# Patient Record
Sex: Female | Born: 1990 | Hispanic: Yes | Marital: Single | Smoking: Never smoker
Health system: Southern US, Community
[De-identification: ages and names within clinical notes are randomized; demographics above are authoritative.]

## PROBLEM LIST (undated history)

## (undated) DIAGNOSIS — Z789 Other specified health status: Secondary | ICD-10-CM

## (undated) HISTORY — PX: NO PAST SURGERIES: SHX2092

---

## 2017-09-26 LAB — OB RESULTS CONSOLE HIV ANTIBODY (ROUTINE TESTING): HIV: NONREACTIVE

## 2017-09-26 LAB — OB RESULTS CONSOLE RUBELLA ANTIBODY, IGM: Rubella: IMMUNE

## 2017-09-26 LAB — OB RESULTS CONSOLE ANTIBODY SCREEN: Antibody Screen: NEGATIVE

## 2017-09-26 LAB — OB RESULTS CONSOLE GC/CHLAMYDIA
CHLAMYDIA, DNA PROBE: NEGATIVE
Gonorrhea: NEGATIVE

## 2017-09-26 LAB — OB RESULTS CONSOLE ABO/RH: RH TYPE: POSITIVE

## 2017-09-26 LAB — OB RESULTS CONSOLE RPR: RPR: NONREACTIVE

## 2017-09-30 ENCOUNTER — Other Ambulatory Visit (HOSPITAL_COMMUNITY): Payer: Self-pay | Admitting: Nurse Practitioner

## 2017-09-30 DIAGNOSIS — Z3A13 13 weeks gestation of pregnancy: Secondary | ICD-10-CM

## 2017-09-30 DIAGNOSIS — Z3682 Encounter for antenatal screening for nuchal translucency: Secondary | ICD-10-CM

## 2017-10-02 ENCOUNTER — Encounter (HOSPITAL_COMMUNITY): Payer: Self-pay | Admitting: *Deleted

## 2017-10-04 ENCOUNTER — Encounter (HOSPITAL_COMMUNITY): Payer: Self-pay

## 2017-10-04 ENCOUNTER — Ambulatory Visit (HOSPITAL_COMMUNITY)
Admission: RE | Admit: 2017-10-04 | Discharge: 2017-10-04 | Disposition: A | Discharge status: Home / Self Care | Payer: Self-pay | Source: Ambulatory Visit | Attending: Nurse Practitioner | Admitting: Nurse Practitioner

## 2017-10-04 DIAGNOSIS — Z3682 Encounter for antenatal screening for nuchal translucency: Secondary | ICD-10-CM | POA: Insufficient documentation

## 2017-10-04 DIAGNOSIS — Z3A13 13 weeks gestation of pregnancy: Secondary | ICD-10-CM

## 2017-10-04 HISTORY — DX: Other specified health status: Z78.9

## 2017-10-04 IMAGING — US US MFM FETAL NUCHAL TRANSLUCENCY
1 series · 15 of 28 positions shown · non-contrast
Comparison: none

[Series 1: us mfm fetal nuchal translucency · 15 of 36 slices shown]
[im 1/36]
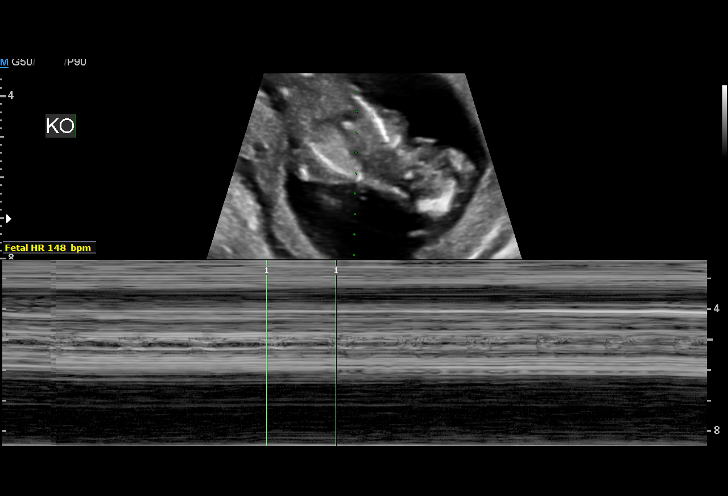
[im 3/36]
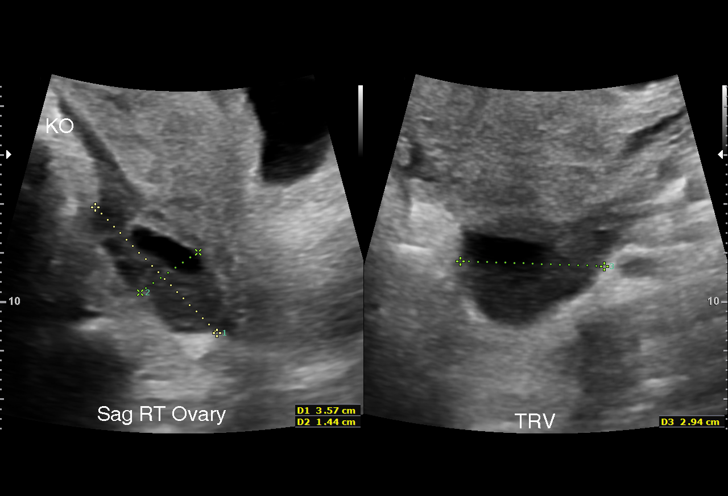
[im 6/36]
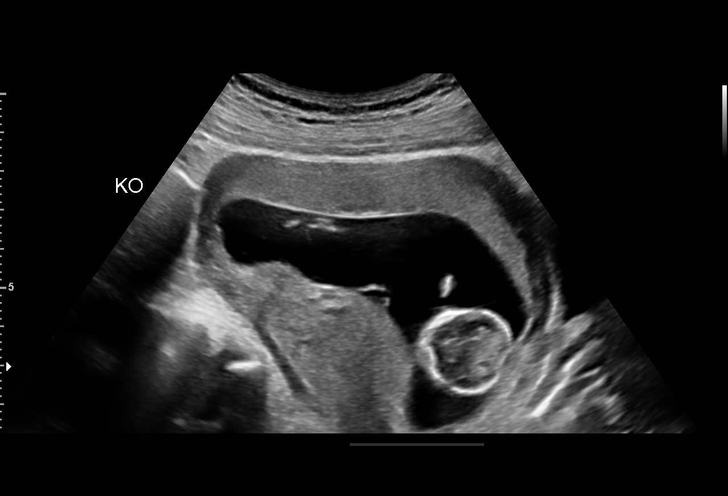
[im 8/36]
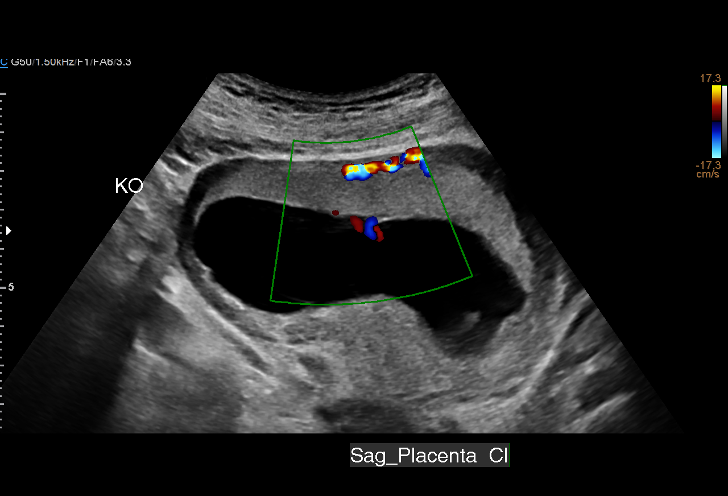
[im 11/36]
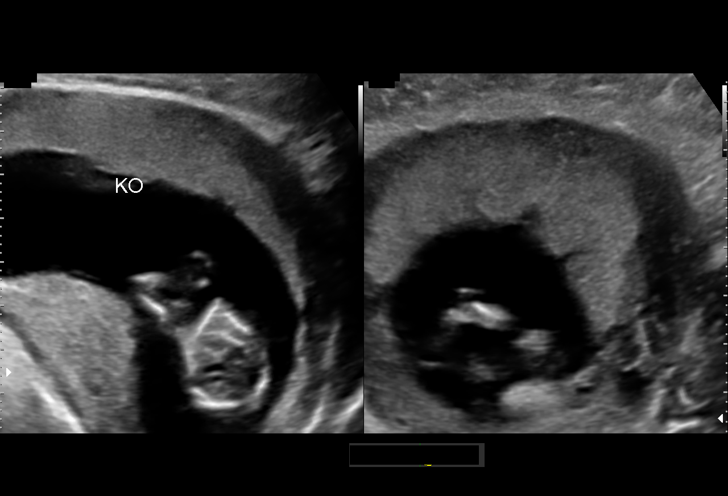
[im 13/36]
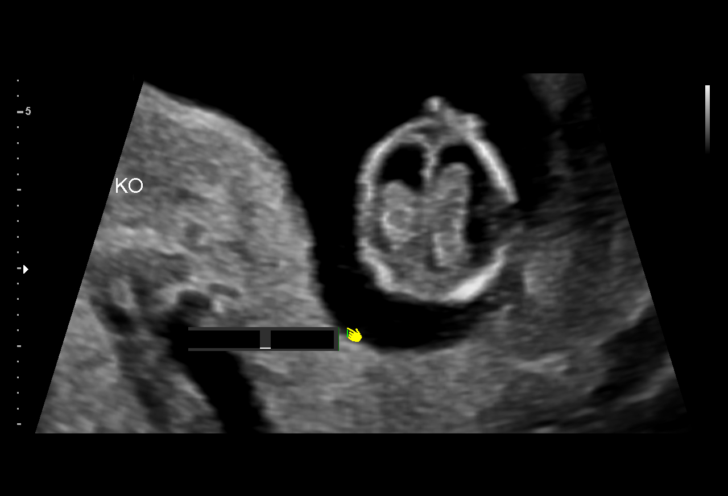
[im 16/36]
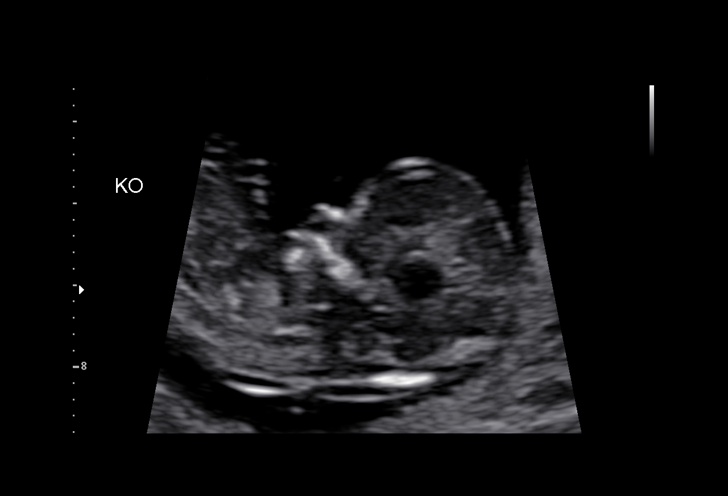
[im 19/36]
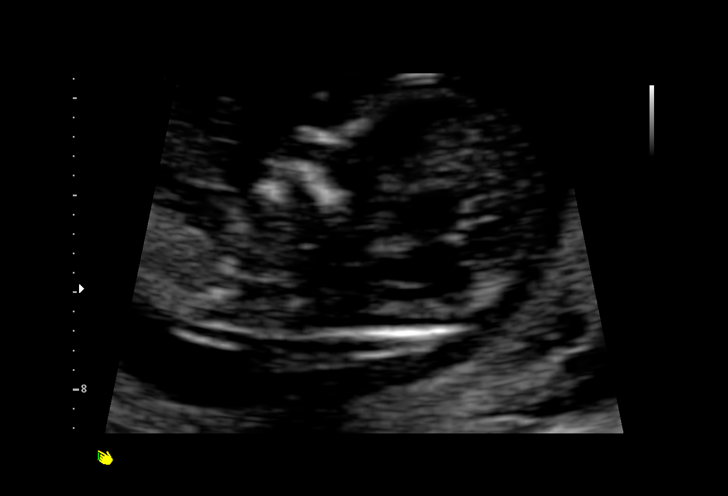
[im 20/36]
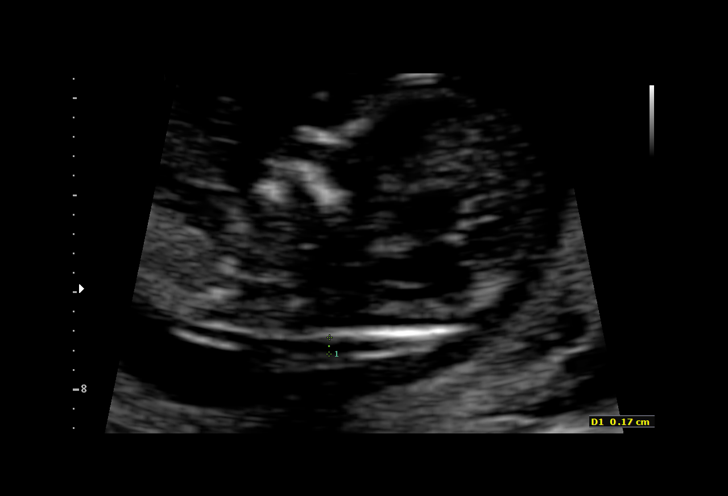
[im 23/36]
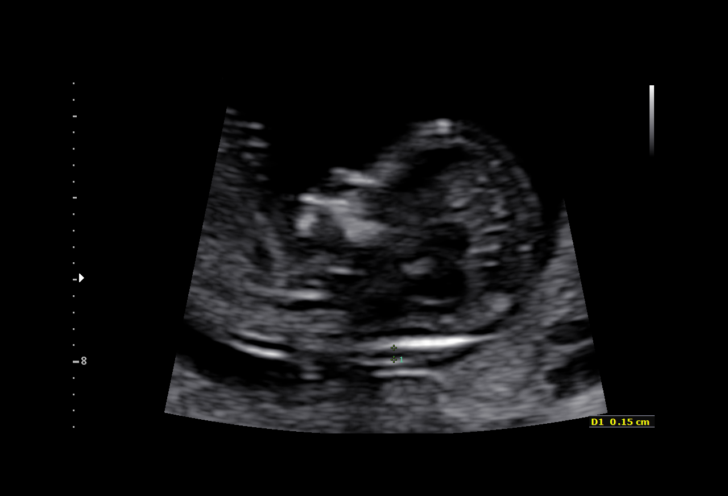
[im 25/36]
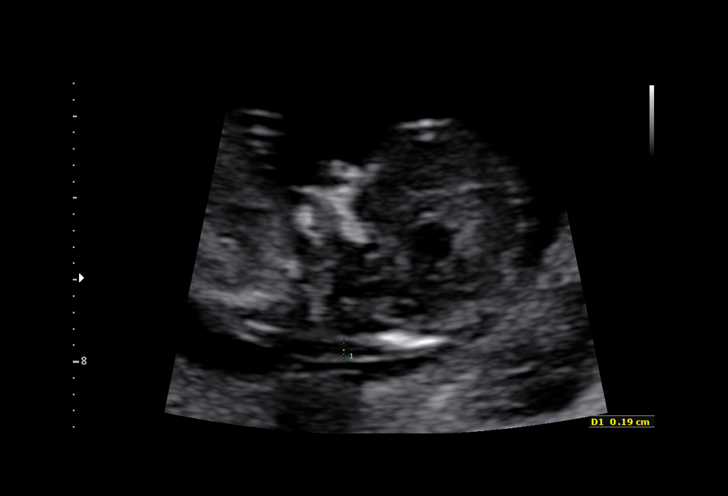
[im 28/36]
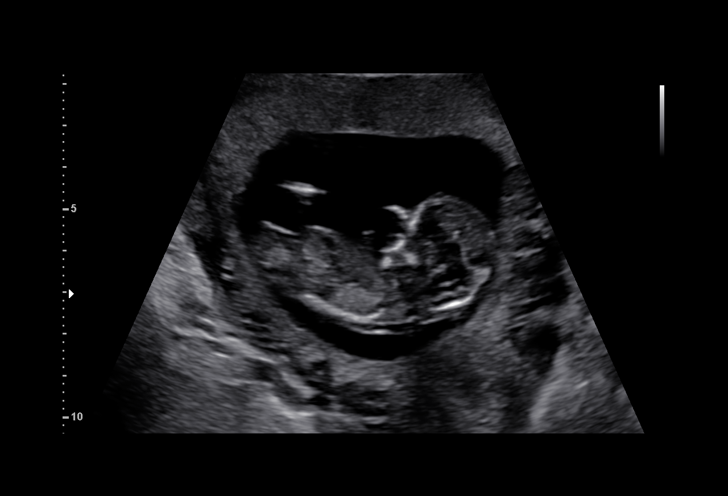
[im 30/36]
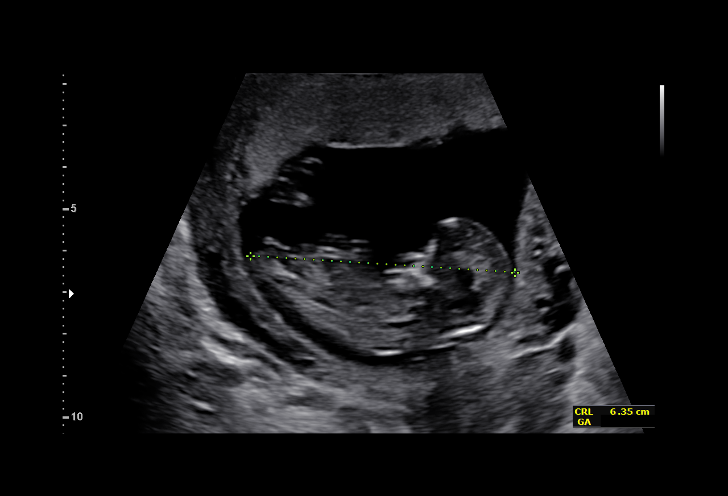
[im 33/36]
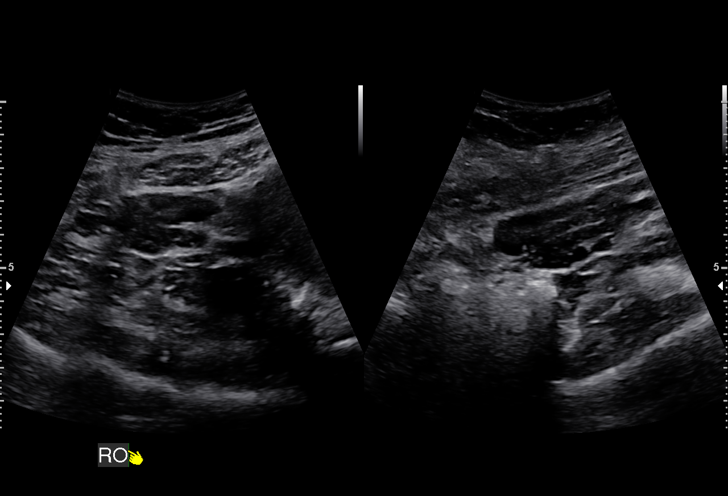
[im 36/36]
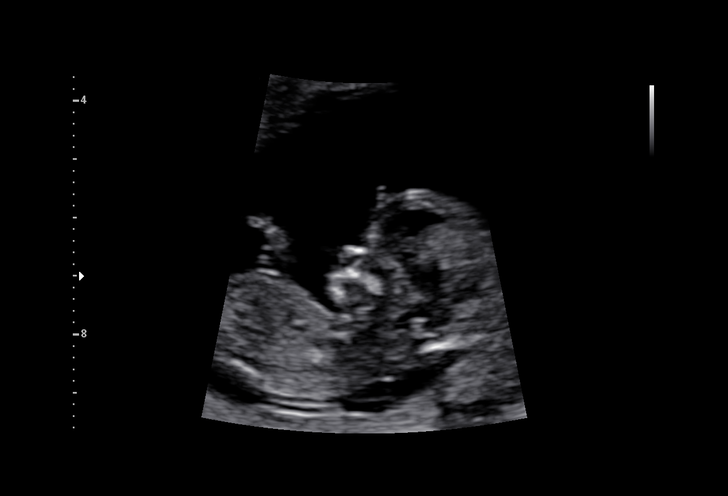

[15 of 28 positions shown; findings below may reference images not displayed]

[REDACTED]-
Faculty Physician

TRANSLUCENCY

1  MERVET             [PHONE_NUMBER]      [PHONE_NUMBER]     [PHONE_NUMBER]
Indications

13 weeks gestation of pregnancy
Encounter for nuchal translucency              [58]
OB History

Gravidity:    2         Term:   1        Prem:   0        SAB:   0
TOP:          0       Ectopic:  0        Living: 1
Fetal Evaluation

Num Of Fetuses:     1
Preg. Location:     Intrauterine
Gest. Sac:          Intrauterine
Fetal Pole:         Visualized
Fetal Heart         148
Rate(bpm):
Cardiac Activity:   Observed
Gestational Age

LMP:           13w 0d        Date:  [DATE]                 EDD:   [DATE]
Best:          13w 0d     Det. By:  LMP  ([DATE])          EDD:   [DATE]
1st Trimester Genetic Sonogram Screening

CRL:            63.5  mm    G. Age:   12w 4d                 EDD:   [DATE]
Nuc Trans:       1.5  mm
Nasal Bone:                 Present
Cervix Uterus Adnexa

Cervix
Closed

Uterus
No abnormality visualized.

Right Ovary
Within normal limits.

Cul De Sac:   No free fluid seen.

Adnexa:       No abnormality visualized.
Impression

IUP at 13+0 weeks, here for first trimester screen
Normal fetal cardiac activity
Normal fetal morphology; nasal bone present
CRL confirms menstrual dating
Nuchal translucency measures 1.5mm
Recommendations

Serum analytes to be drawn today
Recommend anatomic survey in 6-8 weeks

## 2017-10-16 ENCOUNTER — Other Ambulatory Visit: Payer: Self-pay

## 2017-10-29 LAB — OB RESULTS CONSOLE HEPATITIS B SURFACE ANTIGEN: Hepatitis B Surface Ag: NEGATIVE

## 2017-12-03 NOTE — L&D Delivery Note (Addendum)
Delivery Note At 5:41 AM a viable and healthy female was delivered via Vaginal, Spontaneous (Presentation: cephalid; LOA  ).  APGAR: 9, 9;  Placenta status: spontaneous, intact .  Cord 3 vessel with no complications   Anesthesia:  epidural Episiotomy: None Lacerations: 1st degree Suture Repair: No repair needed, wound edges already well approximated and wound hemostatic Est. Blood Loss (mL): 200  Mom to postpartum.  Baby to Couplet care / Skin to Skin.  Myrene BuddyJacob Fletcher 03/27/2018, 5:58 AM  I was present with the resident for the entire delivery. I agree with the above note.  Thressa ShellerHeather Hogan 7:59 AM 04/02/18

## 2018-03-14 LAB — OB RESULTS CONSOLE GBS: STREP GROUP B AG: NEGATIVE

## 2018-03-27 ENCOUNTER — Inpatient Hospital Stay (HOSPITAL_COMMUNITY): Payer: Medicaid Other | Admitting: Anesthesiology

## 2018-03-27 ENCOUNTER — Inpatient Hospital Stay (HOSPITAL_COMMUNITY)
Admission: AD | Admit: 2018-03-27 | Discharge: 2018-03-28 | DRG: 807 | Disposition: A | Discharge status: Home / Self Care | Payer: Medicaid Other | Source: Ambulatory Visit | Attending: Obstetrics & Gynecology | Admitting: Obstetrics & Gynecology

## 2018-03-27 ENCOUNTER — Encounter (HOSPITAL_COMMUNITY): Payer: Self-pay | Admitting: *Deleted

## 2018-03-27 DIAGNOSIS — O4292 Full-term premature rupture of membranes, unspecified as to length of time between rupture and onset of labor: Principal | ICD-10-CM | POA: Diagnosis present

## 2018-03-27 DIAGNOSIS — Z3A37 37 weeks gestation of pregnancy: Secondary | ICD-10-CM

## 2018-03-27 LAB — CBC
HCT: 35.4 % — ABNORMAL LOW (ref 36.0–46.0)
Hemoglobin: 12.3 g/dL (ref 12.0–15.0)
MCH: 29.6 pg (ref 26.0–34.0)
MCHC: 34.7 g/dL (ref 30.0–36.0)
MCV: 85.1 fL (ref 78.0–100.0)
PLATELETS: 210 10*3/uL (ref 150–400)
RBC: 4.16 MIL/uL (ref 3.87–5.11)
RDW: 13.8 % (ref 11.5–15.5)
WBC: 16 10*3/uL — AB (ref 4.0–10.5)

## 2018-03-27 LAB — ABO/RH: ABO/RH(D): A POS

## 2018-03-27 LAB — COMPREHENSIVE METABOLIC PANEL
ALT: 23 U/L (ref 14–54)
AST: 25 U/L (ref 15–41)
Albumin: 2.9 g/dL — ABNORMAL LOW (ref 3.5–5.0)
Alkaline Phosphatase: 168 U/L — ABNORMAL HIGH (ref 38–126)
Anion gap: 11 (ref 5–15)
BILIRUBIN TOTAL: 0.5 mg/dL (ref 0.3–1.2)
BUN: 7 mg/dL (ref 6–20)
CO2: 19 mmol/L — ABNORMAL LOW (ref 22–32)
CREATININE: 0.47 mg/dL (ref 0.44–1.00)
Calcium: 9 mg/dL (ref 8.9–10.3)
Chloride: 105 mmol/L (ref 101–111)
Glucose, Bld: 94 mg/dL (ref 65–99)
POTASSIUM: 3.9 mmol/L (ref 3.5–5.1)
Sodium: 135 mmol/L (ref 135–145)
TOTAL PROTEIN: 6.5 g/dL (ref 6.5–8.1)

## 2018-03-27 LAB — RPR: RPR Ser Ql: NONREACTIVE

## 2018-03-27 LAB — TYPE AND SCREEN
ABO/RH(D): A POS
Antibody Screen: NEGATIVE

## 2018-03-27 MED ORDER — LACTATED RINGERS IV SOLN
INTRAVENOUS | Status: DC
  Administered 2018-03-27 (×2): via INTRAVENOUS

## 2018-03-27 MED ORDER — FENTANYL 2.5 MCG/ML BUPIVACAINE 1/10 % EPIDURAL INFUSION (WH - ANES)
14.0000 mL/h | INTRAMUSCULAR | Status: DC | PRN
  Administered 2018-03-27: 12 mL/h via EPIDURAL
  Filled 2018-03-27: qty 100

## 2018-03-27 MED ORDER — SIMETHICONE 80 MG PO CHEW: 80.0000 mg | CHEWABLE_TABLET | ORAL | Status: DC | PRN

## 2018-03-27 MED ORDER — PRENATAL MULTIVITAMIN CH
1.0000 | ORAL_TABLET | Freq: Every day | ORAL | Status: DC
  Administered 2018-03-28: 1 via ORAL

## 2018-03-27 MED ORDER — DIPHENHYDRAMINE HCL 25 MG PO CAPS: 25.0000 mg | ORAL_CAPSULE | Freq: Four times a day (QID) | ORAL | Status: DC | PRN

## 2018-03-27 MED ORDER — LIDOCAINE HCL (PF) 1 % IJ SOLN
INTRAMUSCULAR | Status: DC | PRN
  Administered 2018-03-27: 6 mL
  Administered 2018-03-27: 4 mL

## 2018-03-27 MED ORDER — OXYTOCIN 40 UNITS IN LACTATED RINGERS INFUSION - SIMPLE MED
2.5000 [IU]/h | INTRAVENOUS | Status: DC
  Filled 2018-03-27: qty 1000

## 2018-03-27 MED ORDER — EPHEDRINE 5 MG/ML INJ
10.0000 mg | INTRAVENOUS | Status: DC | PRN
  Filled 2018-03-27: qty 2

## 2018-03-27 MED ORDER — ACETAMINOPHEN 325 MG PO TABS
650.0000 mg | ORAL_TABLET | ORAL | Status: DC | PRN
  Administered 2018-03-27: 650 mg via ORAL
  Filled 2018-03-27: qty 2

## 2018-03-27 MED ORDER — PHENYLEPHRINE 40 MCG/ML (10ML) SYRINGE FOR IV PUSH (FOR BLOOD PRESSURE SUPPORT)
80.0000 ug | PREFILLED_SYRINGE | INTRAVENOUS | Status: DC | PRN
Start: 2018-03-27 — End: 2018-03-27
  Filled 2018-03-27: qty 10
  Filled 2018-03-27: qty 5

## 2018-03-27 MED ORDER — PHENYLEPHRINE 40 MCG/ML (10ML) SYRINGE FOR IV PUSH (FOR BLOOD PRESSURE SUPPORT)
80.0000 ug | PREFILLED_SYRINGE | INTRAVENOUS | Status: DC | PRN
  Filled 2018-03-27: qty 5

## 2018-03-27 MED ORDER — OXYCODONE-ACETAMINOPHEN 5-325 MG PO TABS: 1.0000 | ORAL_TABLET | ORAL | Status: DC | PRN

## 2018-03-27 MED ORDER — DIPHENHYDRAMINE HCL 50 MG/ML IJ SOLN: 12.5000 mg | INTRAMUSCULAR | Status: DC | PRN

## 2018-03-27 MED ORDER — DIBUCAINE 1 % RE OINT: 1.0000 "application " | TOPICAL_OINTMENT | RECTAL | Status: DC | PRN

## 2018-03-27 MED ORDER — TETANUS-DIPHTH-ACELL PERTUSSIS 5-2.5-18.5 LF-MCG/0.5 IM SUSP: 0.5000 mL | Freq: Once | INTRAMUSCULAR | Status: DC

## 2018-03-27 MED ORDER — LACTATED RINGERS IV SOLN
500.0000 mL | Freq: Once | INTRAVENOUS | Status: AC
  Administered 2018-03-27: 500 mL via INTRAVENOUS

## 2018-03-27 MED ORDER — SENNOSIDES-DOCUSATE SODIUM 8.6-50 MG PO TABS
2.0000 | ORAL_TABLET | ORAL | Status: DC
  Administered 2018-03-27: 2 via ORAL
  Filled 2018-03-27: qty 2

## 2018-03-27 MED ORDER — OXYTOCIN BOLUS FROM INFUSION
500.0000 mL | Freq: Once | INTRAVENOUS | Status: AC
  Administered 2018-03-27: 500 mL via INTRAVENOUS

## 2018-03-27 MED ORDER — EPHEDRINE 5 MG/ML INJ
10.0000 mg | INTRAVENOUS | Status: DC | PRN
Start: 2018-03-27 — End: 2018-03-27
  Filled 2018-03-27: qty 2

## 2018-03-27 MED ORDER — COCONUT OIL OIL: 1.0000 "application " | TOPICAL_OIL | Status: DC | PRN

## 2018-03-27 MED ORDER — ONDANSETRON HCL 4 MG PO TABS: 4.0000 mg | ORAL_TABLET | ORAL | Status: DC | PRN

## 2018-03-27 MED ORDER — ONDANSETRON HCL 4 MG/2ML IJ SOLN: 4.0000 mg | INTRAMUSCULAR | Status: DC | PRN

## 2018-03-27 MED ORDER — LACTATED RINGERS IV SOLN: 500.0000 mL | INTRAVENOUS | Status: DC | PRN

## 2018-03-27 MED ORDER — SOD CITRATE-CITRIC ACID 500-334 MG/5ML PO SOLN: 30.0000 mL | ORAL | Status: DC | PRN

## 2018-03-27 MED ORDER — WITCH HAZEL-GLYCERIN EX PADS: 1.0000 "application " | MEDICATED_PAD | CUTANEOUS | Status: DC | PRN

## 2018-03-27 MED ORDER — LIDOCAINE HCL (PF) 1 % IJ SOLN
30.0000 mL | INTRAMUSCULAR | Status: DC | PRN
  Filled 2018-03-27: qty 30

## 2018-03-27 MED ORDER — IBUPROFEN 600 MG PO TABS
600.0000 mg | ORAL_TABLET | Freq: Four times a day (QID) | ORAL | Status: DC
  Administered 2018-03-27 – 2018-03-28 (×5): 600 mg via ORAL
  Filled 2018-03-27 (×5): qty 1

## 2018-03-27 MED ORDER — FENTANYL CITRATE (PF) 100 MCG/2ML IJ SOLN
50.0000 ug | INTRAMUSCULAR | Status: DC | PRN
  Administered 2018-03-27: 100 ug via INTRAVENOUS
  Filled 2018-03-27: qty 2

## 2018-03-27 MED ORDER — ZOLPIDEM TARTRATE 5 MG PO TABS: 5.0000 mg | ORAL_TABLET | Freq: Every evening | ORAL | Status: DC | PRN

## 2018-03-27 MED ORDER — BENZOCAINE-MENTHOL 20-0.5 % EX AERO: 1.0000 "application " | INHALATION_SPRAY | CUTANEOUS | Status: DC | PRN

## 2018-03-27 MED ORDER — ONDANSETRON HCL 4 MG/2ML IJ SOLN
4.0000 mg | Freq: Four times a day (QID) | INTRAMUSCULAR | Status: DC | PRN
  Administered 2018-03-27: 4 mg via INTRAVENOUS
  Filled 2018-03-27: qty 2

## 2018-03-27 MED ORDER — ACETAMINOPHEN 325 MG PO TABS
650.0000 mg | ORAL_TABLET | ORAL | Status: DC | PRN
Start: 2018-03-27 — End: 2018-03-27

## 2018-03-27 MED ORDER — OXYCODONE-ACETAMINOPHEN 5-325 MG PO TABS: 2.0000 | ORAL_TABLET | ORAL | Status: DC | PRN

## 2018-03-27 MED ORDER — FLEET ENEMA 7-19 GM/118ML RE ENEM: 1.0000 | ENEMA | RECTAL | Status: DC | PRN

## 2018-03-27 NOTE — Anesthesia Preprocedure Evaluation (Signed)

## 2018-03-27 NOTE — Anesthesia Postprocedure Evaluation (Signed)
Anesthesia Post Note  Patient: Linda Snyder  Procedure(s) Performed: AN AD HOC LABOR EPIDURAL     Patient location during evaluation: Mother Baby Anesthesia Type: Epidural Level of consciousness: awake and alert Pain management: pain level controlled Vital Signs Assessment: post-procedure vital signs reviewed and stable Respiratory status: spontaneous breathing, nonlabored ventilation and respiratory function stable Cardiovascular status: stable Postop Assessment: no headache, no backache and epidural receding Anesthetic complications: no    Last Vitals:  Vitals:   03/27/18 0745 03/27/18 0900  BP: 119/73 121/65  Pulse: (!) 113 (!) 103  Resp: 20 20  Temp: 37.3 C 37.6 C    Last Pain:  Vitals:   03/27/18 0900  TempSrc: Oral  PainSc: 3    Pain Goal:                 Malessa Zartman N

## 2018-03-27 NOTE — MAU Note (Signed)
Pt presents to MAU c/o SROM at 1700. Pt reports a yellowish fluid with some blood. Pt states no FM since her water broke.

## 2018-03-27 NOTE — Lactation Note (Addendum)
This note was copied from a baby's chart. Lactation Consultation Note  Patient Name: Linda Snyder ZOXWR'UToday's Date: 03/27/2018 Reason for consult: Initial assessment;Early term [id number]  11 hours old late preterm female who is being exclusively formula fed while in the hospital, mom plans to pump and bottle once her milk comes in like she did with her first child. Lactogenesis II and engorgement were discussed, mom requested a hand pump. Encouraged mom to provide as much breastmilk to her baby as she can; even if it's on a bottle; discussed benefits of breastmilk. Reviewed BF brochure (SP), BF resources and feeding diary (SP) mom is aware of LC services and will contact PRN.  Maternal Data Formula Feeding for Exclusion: No Does the patient have breastfeeding experience prior to this delivery?: Yes    Interventions Interventions: Breast feeding basics reviewed  Lactation Tools Discussed/Used WIC Program: Yes Pump Review: Setup, frequency, and cleaning Initiated by:: MPeck Date initiated:: 03/27/18   Consult Status Consult Status: Complete    Linda Snyder 03/27/2018, 5:34 PM

## 2018-03-27 NOTE — H&P (Addendum)
OBSTETRIC ADMISSION HISTORY AND PHYSICAL  Linda Snyder is a 27 y.o. female G2P1001 with IUP at [redacted]w[redacted]d by LMP presenting for SROM. She reports +FMs, No LOF, no VB, no blurry vision, headaches or peripheral edema, and RUQ pain.  She plans on breast feeding. She is undecided about birth control. She [facility] her [facility]   Dating: By LMP --->  Estimated Date of Delivery: 04/11/18  Sono:    @[redacted]w[redacted]d , CWD, normal anatomy, cephalic presentation   Prenatal History/Complications:  Past Medical History: Past Medical History:  Diagnosis Date  . Medical history non-contributory     Past Surgical History: Past Surgical History:  Procedure Laterality Date  . NO PAST SURGERIES      Obstetrical History: OB History    Gravida  2   Para  1   Term  1   Preterm      AB      Living  1     SAB      TAB      Ectopic      Multiple      Live Births              Social History: Social History   Socioeconomic History  . Marital status: Single    Spouse name: Not on file  . Number of children: Not on file  . Years of education: Not on file  . Highest education level: Not on file  Occupational History  . Not on file  Social Needs  . Financial resource strain: Not on file  . Food insecurity:    Worry: Not on file    Inability: Not on file  . Transportation needs:    Medical: Not on file    Non-medical: Not on file  Tobacco Use  . Smoking status: Never Smoker  . Smokeless tobacco: Never Used  Substance and Sexual Activity  . Alcohol use: No  . Drug use: No  . Sexual activity: Not on file  Lifestyle  . Physical activity:    Days per week: Not on file    Minutes per session: Not on file  . Stress: Not on file  Relationships  . Social connections:    Talks on phone: Not on file    Gets together: Not on file    Attends religious service: Not on file    Active member of club or organization: Not on file    Attends meetings of clubs or  organizations: Not on file    Relationship status: Not on file  Other Topics Concern  . Not on file  Social History Narrative  . Not on file    Family History: History reviewed. No pertinent family history.  Allergies: No Known Allergies  Medications Prior to Admission  Medication Sig Dispense Refill Last Dose  . Prenatal Vit-Fe Fumarate-FA (PRENATAL VITAMIN PO) Take by mouth.   Taking     Review of Systems   All systems reviewed and negative except as stated in HPI  Last menstrual period 07/05/2017. General appearance: alert and cooperative Lungs: clear to auscultation bilaterally Heart: regular rate and rhythm Abdomen: soft, non-tender; bowel sounds normal Extremities: Homans sign is negative, no sign of DVT Presentation: cephalic Fetal monitoringBaseline: 140 bpm, Variability: Good {> 6 bpm), Accelerations: Non-reactive but appropriate for gestational age and Decelerations: Absent Uterine activityFrequency: Every 2-3 minutes Dilation: 6 Effacement (%): 90 Station: -2 Exam by:: Rockwell [place] rn    Prenatal labs: ABO, Rh: A/Positive/-- (10/25 0000)  Antibody: Negative (10/25 0000) Rubella: Immune (10/25 0000) RPR: Nonreactive (10/25 0000)  HBsAg: Negative (11/27 0000)  HIV: Non-reactive (10/25 0000)  GBS: Negative (04/12 0000)  1 hr Glucola 1 hour -> 125 Genetic screening  neg Anatomy US normal  Prenatal Transfer Tool  Maternal Diabetes: No Genetic Screening: Normal Maternal Ultrasounds/Referrals: Normal Fetal Ultrasounds or other Referrals:  None Maternal Substance Abuse:  No Significant Maternal Medications:  None Significant Maternal Lab Results: None  Results for orders placed or performed during the hospital encounter of 03/27/18 (from the past 24 hour(s))  CBC   Collection Time: 03/27/18  2:04 AM  Result Value Ref Range   WBC 16.0 (H) 4.0 - 10.5 K/uL   RBC 4.16 3.87 - 5.11 MIL/uL   Hemoglobin 12.3 12.0 - 15.0 g/dL   HCT 36.635.4 (L) 44.036.0 - 34.746.0 %    MCV 85.1 78.0 - 100.0 fL   MCH 29.6 26.0 - 34.0 pg   MCHC 34.7 30.0 - 36.0 g/dL   RDW 42.513.8 95.611.5 - 38.715.5 %   Platelets 210 150 - 400 K/uL    Patient Active Problem List   Diagnosis Date Noted  . Labor and delivery, indication for care 03/27/2018    Assessment/Plan:  Linda Snyder is a 27 y.o. G2P1001 at 4150w6d here for SROM @ 1700. Uncomplicated prenatal care, will admit for expectant management. Can likely start pitocin if does not continue to change rapidly.  #Labor: s/p srom, expectant management #Pain: Analgesia prn #FWB: Cat 1 #ID:  n/a #MOF: Breast #MOC: undecided #Circ:  N/A  Myrene BuddyJacob Fletcher, MD  03/27/2018, 2:26 AM   I confirm that I have verified the information documented in the resident's note and that I have also personally reperformed the physical exam and all medical decision making activities.    Thressa ShellerHeather Hogan 4:04 AM 03/27/18

## 2018-03-27 NOTE — Anesthesia Procedure Notes (Signed)
Epidural Patient location during procedure: OB  Staffing Anesthesiologist: Colden Samaras, MD  Preanesthetic Checklist Completed: patient identified, pre-op evaluation, timeout performed, IV checked, risks and benefits discussed and monitors and equipment checked  Epidural Patient position: sitting Prep: DuraPrep Patient monitoring: blood pressure and continuous pulse ox Approach: right paramedian Location: L3-L4 Injection technique: LOR air  Needle:  Needle type: Tuohy  Needle gauge: 17 G Needle insertion depth: 5 cm Catheter type: closed end flexible Catheter size: 19 Gauge Catheter at skin depth: 10 cm Test dose: negative  Assessment Sensory level: T8  Additional Notes   Dosing of Epidural:  1st dose, through catheter .............................................  Xylocaine 40 mg  2nd dose, through catheter, after waiting 3 minutes.........Xylocaine 60 mg    As each dose occurred, patient was free of IV sx; and patient exhibited no evidence of SA injection.  Patient is more comfortable after epidural dosed. Please see RN's note for documentation of vital signs,and FHR which are stable.  Patient reminded not to try to ambulate with numb legs, and that an RN must be present when she attempts to get up.          

## 2018-03-28 LAB — BIRTH TISSUE RECOVERY COLLECTION (PLACENTA DONATION)

## 2018-03-28 MED ORDER — IBUPROFEN 600 MG PO TABS: 600.0000 mg | ORAL_TABLET | Freq: Four times a day (QID) | ORAL | 0 refills | Status: DC | PRN

## 2018-03-28 NOTE — Discharge Instructions (Signed)
Vaginal Delivery, Care After °Refer to this sheet in the next few weeks. These instructions provide you with information about caring for yourself after vaginal delivery. Your health care provider may also give you more specific instructions. Your treatment has been planned according to current medical practices, but problems sometimes occur. Call your health care provider if you have any problems or questions. °What can I expect after the procedure? °After vaginal delivery, it is common to have: °· Some bleeding from your vagina. °· Soreness in your abdomen, your vagina, and the area of skin between your vaginal opening and your anus (perineum). °· Pelvic cramps. °· Fatigue. ° °Follow these instructions at home: °Medicines °· Take over-the-counter and prescription medicines only as told by your health care provider. °· If you were prescribed an antibiotic medicine, take it as told by your health care provider. Do not stop taking the antibiotic until it is finished. °Driving ° °· Do not drive or operate heavy machinery while taking prescription pain medicine. °· Do not drive for 24 hours if you received a sedative. °Lifestyle °· Do not drink alcohol. This is especially important if you are breastfeeding or taking medicine to relieve pain. °· Do not use tobacco products, including cigarettes, chewing tobacco, or e-cigarettes. If you need help quitting, ask your health care provider. °Eating and drinking °· Drink at least 8 eight-ounce glasses of water every day unless you are told not to by your health care provider. If you choose to breastfeed your baby, you may need to drink more water than this. °· Eat high-fiber foods every day. These foods may help prevent or relieve constipation. High-fiber foods include: °? Whole grain cereals and breads. °? Brown rice. °? Beans. °? Fresh fruits and vegetables. °Activity °· Return to your normal activities as told by your health care provider. Ask your health care provider  what activities are safe for you. °· Rest as much as possible. Try to rest or take a nap when your baby is sleeping. °· Do not lift anything that is heavier than your baby or 10 lb (4.5 kg) until your health care provider says that it is safe. °· Talk with your health care provider about when you can engage in sexual activity. This may depend on your: °? Risk of infection. °? Rate of healing. °? Comfort and desire to engage in sexual activity. °Vaginal Care °· If you have an episiotomy or a vaginal tear, check the area every day for signs of infection. Check for: °? More redness, swelling, or pain. °? More fluid or blood. °? Warmth. °? Pus or a bad smell. °· Do not use tampons or douches until your health care provider says this is safe. °· Watch for any blood clots that may pass from your vagina. These may look like clumps of dark red, brown, or black discharge. °General instructions °· Keep your perineum clean and dry as told by your health care provider. °· Wear loose, comfortable clothing. °· Wipe from front to back when you use the toilet. °· Ask your health care provider if you can shower or take a bath. If you had an episiotomy or a perineal tear during labor and delivery, your health care provider may tell you not to take baths for a certain length of time. °· Wear a bra that supports your breasts and fits you well. °· If possible, have someone help you with household activities and help care for your baby for at least a few days after   you leave the hospital. °· Keep all follow-up visits for you and your baby as told by your health care provider. This is important. °Contact a health care provider if: °· You have: °? Vaginal discharge that has a bad smell. °? Difficulty urinating. °? Pain when urinating. °? A sudden increase or decrease in the frequency of your bowel movements. °? More redness, swelling, or pain around your episiotomy or vaginal tear. °? More fluid or blood coming from your episiotomy or  vaginal tear. °? Pus or a bad smell coming from your episiotomy or vaginal tear. °? A fever. °? A rash. °? Little or no interest in activities you used to enjoy. °? Questions about caring for yourself or your baby. °· Your episiotomy or vaginal tear feels warm to the touch. °· Your episiotomy or vaginal tear is separating or does not appear to be healing. °· Your breasts are painful, hard, or turn red. °· You feel unusually sad or worried. °· You feel nauseous or you vomit. °· You pass large blood clots from your vagina. If you pass a blood clot from your vagina, save it to show to your health care provider. Do not flush blood clots down the toilet without having your health care provider look at them. °· You urinate more than usual. °· You are dizzy or light-headed. °· You have not breastfed at all and you have not had a menstrual period for 12 weeks after delivery. °· You have stopped breastfeeding and you have not had a menstrual period for 12 weeks after you stopped breastfeeding. °Get help right away if: °· You have: °? Pain that does not go away or does not get better with medicine. °? Chest pain. °? Difficulty breathing. °? Blurred vision or spots in your vision. °? Thoughts about hurting yourself or your baby. °· You develop pain in your abdomen or in one of your legs. °· You develop a severe headache. °· You faint. °· You bleed from your vagina so much that you fill two sanitary pads in one hour. °This information is not intended to replace advice given to you by your health care provider. Make sure you discuss any questions you have with your health care provider. °Document Released: 11/16/2000 Document Revised: 05/02/2016 Document Reviewed: 12/04/2015 °Elsevier Interactive Patient Education © 2018 [employer] ° °

## 2018-03-28 NOTE — Discharge Summary (Signed)
OB Discharge Summary     Patient Name: Linda Snyder DOB: 12-07-1990 MRN: [id number]  Date of admission: 03/27/2018 Delivering MD: Thressa Sheller D   Date of discharge: 03/28/2018  Admitting diagnosis: 37.5 WEEKS CTX BACK PAIN LEAKING FLUID  Intrauterine pregnancy: [redacted]w[redacted]d     Secondary diagnosis:  Active Problems:   Labor and delivery, indication for care SROM  Additional problems: GBS neg     Discharge diagnosis: Term Pregnancy Delivered                                                                                                Post partum procedures:none  Augmentation: none  Complications: None  Hospital course:  Onset of Labor With Vaginal Delivery     27 y.o. yo [id number] at [redacted]w[redacted]d was admitted in Active Labor on 03/27/2018. Patient had an uncomplicated labor course as follows:  Membrane Rupture Time/Date: 5:00 PM ,03/27/2018   Intrapartum Procedures: Episiotomy: None [1]                                         Lacerations:  1st degree [2]  Patient had a delivery of a Viable infant. 03/27/2018  Information for the patient's newborn:  Tanganyika Bowlds, Girl Joory [[id number]]  Delivery Method: Vaginal, Spontaneous(Filed from Delivery Summary)    Pateint had an uncomplicated postpartum course.  She is ambulating, tolerating a regular diet, passing flatus, and urinating well. Patient is discharged home in stable condition on 03/28/18 per her request for an early d/c.   Physical exam  Vitals:   03/27/18 0900 03/27/18 1630 03/27/18 2254 03/28/18 0526  BP: 121/65 113/74 120/76 114/82  Pulse: (!) 103 (!) 102 87 97  Resp: 20 18 18 17   Temp: 99.6 F (37.6 C) 98.3 F (36.8 C) 98.6 F (37 C) 98 F (36.7 C)  TempSrc: Oral Oral Oral Oral  SpO2:  92%    Weight:      Height:       General: alert and cooperative Lochia: appropriate Uterine Fundus: firm Incision: N/A DVT Evaluation: No evidence of DVT seen on physical exam. Labs: Lab Results  Component Value Date    WBC 16.0 (H) 03/27/2018   HGB 12.3 03/27/2018   HCT 35.4 (L) 03/27/2018   MCV 85.1 03/27/2018   PLT 210 03/27/2018   CMP Latest Ref Rng & Units 03/27/2018  Glucose 65 - 99 mg/dL 94  BUN 6 - 20 mg/dL 7  Creatinine 6.96 - 2.95 mg/dL 2.84  Sodium 132 - 440 mmol/L 135  Potassium 3.5 - 5.1 mmol/L 3.9  Chloride 101 - 111 mmol/L 105  CO2 22 - 32 mmol/L 19(L)  Calcium 8.9 - 10.3 mg/dL 9.0  Total Protein 6.5 - 8.1 g/dL 6.5  Total Bilirubin 0.3 - 1.2 mg/dL 0.5  Alkaline Phos 38 - 126 U/L 168(H)  AST 15 - 41 U/L 25  ALT 14 - 54 U/L 23    Discharge instruction: per After Visit Summary and "Baby and Me Booklet".  After visit meds:  Allergies as of 03/28/2018   No Known Allergies     Medication List    TAKE these medications   ibuprofen 600 MG tablet Commonly known as:  ADVIL,MOTRIN Take 1 tablet (600 mg total) by mouth every 6 (six) hours as needed.   PRENATAL VITAMIN PO Take by mouth.       Diet: home with mother  Activity: Advance as tolerated. Pelvic rest for 6 weeks.   Outpatient follow up:4 weeks Follow up Appt:No future appointments. Follow up Visit:No follow-ups on file.  Postpartum contraception: Nexplanon  Newborn Data: Live born female  Birth Weight: 6 lb 10.2 oz (3011 g) APGAR: 9, 9  Newborn Delivery   Birth date/time:  03/27/2018 05:41:00 Delivery type:  Vaginal, Spontaneous     Baby Feeding: Bottle Disposition:home with mother   03/28/2018 Cam HaiSHAW, Norina Cowper, CNM 2:11 PM

## 2021-03-31 ENCOUNTER — Other Ambulatory Visit: Payer: Self-pay | Admitting: Obstetrics and Gynecology

## 2021-03-31 ENCOUNTER — Other Ambulatory Visit: Payer: Self-pay

## 2021-03-31 ENCOUNTER — Ambulatory Visit
Admission: RE | Admit: 2021-03-31 | Discharge: 2021-03-31 | Disposition: A | Discharge status: Home / Self Care | Payer: Self-pay | Source: Ambulatory Visit | Attending: Obstetrics and Gynecology | Admitting: Obstetrics and Gynecology

## 2021-03-31 DIAGNOSIS — Z111 Encounter for screening for respiratory tuberculosis: Secondary | ICD-10-CM

## 2022-05-11 ENCOUNTER — Encounter: Payer: Self-pay | Admitting: Neurology

## 2022-05-12 ENCOUNTER — Other Ambulatory Visit: Payer: Self-pay

## 2022-05-12 ENCOUNTER — Encounter (HOSPITAL_COMMUNITY): Payer: Self-pay

## 2022-05-12 ENCOUNTER — Emergency Department (HOSPITAL_COMMUNITY)
Admission: EM | Admit: 2022-05-12 | Discharge: 2022-05-13 | Disposition: A | Discharge status: Home / Self Care | Payer: Self-pay | Attending: Emergency Medicine | Admitting: Emergency Medicine

## 2022-05-12 ENCOUNTER — Emergency Department (HOSPITAL_COMMUNITY): Payer: Self-pay

## 2022-05-12 DIAGNOSIS — R42 Dizziness and giddiness: Secondary | ICD-10-CM | POA: Insufficient documentation

## 2022-05-12 DIAGNOSIS — T161XXA Foreign body in right ear, initial encounter: Secondary | ICD-10-CM | POA: Insufficient documentation

## 2022-05-12 DIAGNOSIS — R519 Headache, unspecified: Secondary | ICD-10-CM | POA: Insufficient documentation

## 2022-05-12 DIAGNOSIS — R202 Paresthesia of skin: Secondary | ICD-10-CM | POA: Insufficient documentation

## 2022-05-12 DIAGNOSIS — R2990 Unspecified symptoms and signs involving the nervous system: Secondary | ICD-10-CM | POA: Insufficient documentation

## 2022-05-12 DIAGNOSIS — X58XXXA Exposure to other specified factors, initial encounter: Secondary | ICD-10-CM | POA: Insufficient documentation

## 2022-05-12 DIAGNOSIS — R531 Weakness: Secondary | ICD-10-CM | POA: Insufficient documentation

## 2022-05-12 DIAGNOSIS — M542 Cervicalgia: Secondary | ICD-10-CM | POA: Insufficient documentation

## 2022-05-12 LAB — CBC WITH DIFFERENTIAL/PLATELET
Abs Immature Granulocytes: 0.05 10*3/uL (ref 0.00–0.07)
Basophils Absolute: 0.1 10*3/uL (ref 0.0–0.1)
Basophils Relative: 1 %
Eosinophils Absolute: 0.2 10*3/uL (ref 0.0–0.5)
Eosinophils Relative: 2 %
HCT: 41.7 % (ref 36.0–46.0)
Hemoglobin: 13.6 g/dL (ref 12.0–15.0)
Immature Granulocytes: 1 %
Lymphocytes Relative: 29 %
Lymphs Abs: 2.7 10*3/uL (ref 0.7–4.0)
MCH: 29.3 pg (ref 26.0–34.0)
MCHC: 32.6 g/dL (ref 30.0–36.0)
MCV: 89.9 fL (ref 80.0–100.0)
Monocytes Absolute: 0.6 10*3/uL (ref 0.1–1.0)
Monocytes Relative: 6 %
Neutro Abs: 5.6 10*3/uL (ref 1.7–7.7)
Neutrophils Relative %: 61 %
Platelets: 315 10*3/uL (ref 150–400)
RBC: 4.64 MIL/uL (ref 3.87–5.11)
RDW: 12.6 % (ref 11.5–15.5)
WBC: 9.2 10*3/uL (ref 4.0–10.5)
nRBC: 0 % (ref 0.0–0.2)

## 2022-05-12 LAB — COMPREHENSIVE METABOLIC PANEL
ALT: 35 U/L (ref 0–44)
AST: 23 U/L (ref 15–41)
Albumin: 4.3 g/dL (ref 3.5–5.0)
Alkaline Phosphatase: 82 U/L (ref 38–126)
Anion gap: 8 (ref 5–15)
BUN: 7 mg/dL (ref 6–20)
CO2: 24 mmol/L (ref 22–32)
Calcium: 9.7 mg/dL (ref 8.9–10.3)
Chloride: 106 mmol/L (ref 98–111)
Creatinine, Ser: 0.67 mg/dL (ref 0.44–1.00)
GFR, Estimated: >60 mL/min (ref >60)
Glucose, Bld: 104 mg/dL — ABNORMAL HIGH (ref 70–99)
Potassium: 3.8 mmol/L (ref 3.5–5.1)
Sodium: 138 mmol/L (ref 135–145)
Total Bilirubin: 0.6 mg/dL (ref 0.3–1.2)
Total Protein: 7.7 g/dL (ref 6.5–8.1)

## 2022-05-12 LAB — I-STAT BETA HCG BLOOD, ED (MC, WL, AP ONLY): I-stat hCG, quantitative: <5 m[IU]/mL (ref <5)

## 2022-05-12 IMAGING — CT CT HEAD W/O CM
4 series · 17 of 47 positions shown, 19 images · non-contrast
Comparison: None Available.

CLINICAL DATA: Headache, concern for MS



[Series 2: head wo · axial · 0.32mm/px · z∈[-182,-66]mm · 7 of 34 slices shown, 9 images]
[im 5/34  brain]
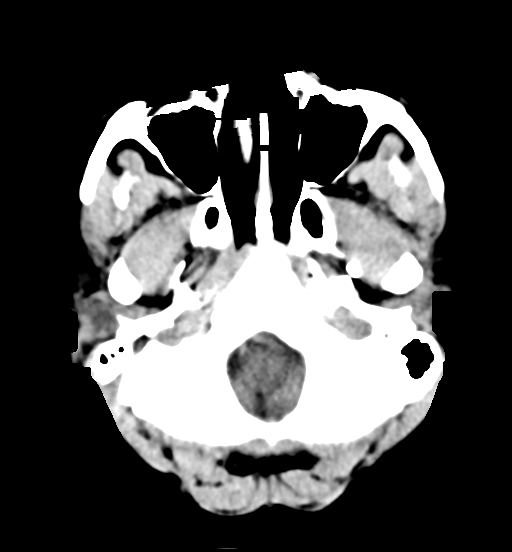
[im 5/34  bone]
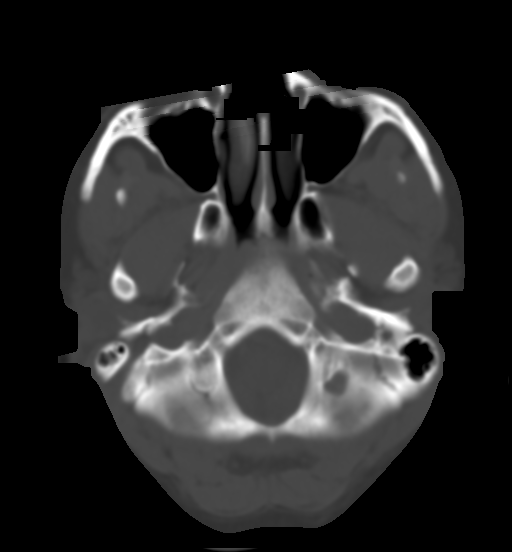
[im 9/34  brain]
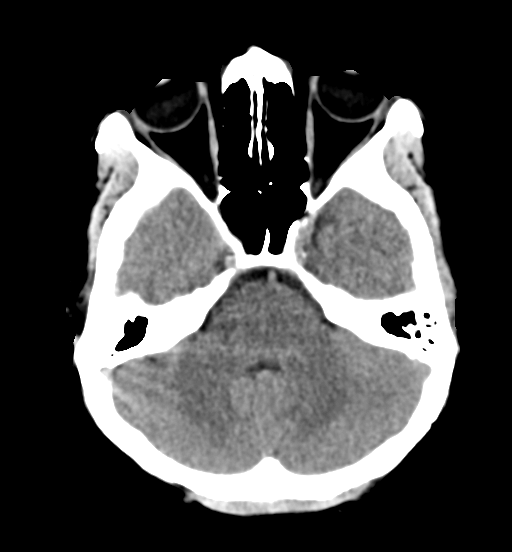
[im 13/34  brain]
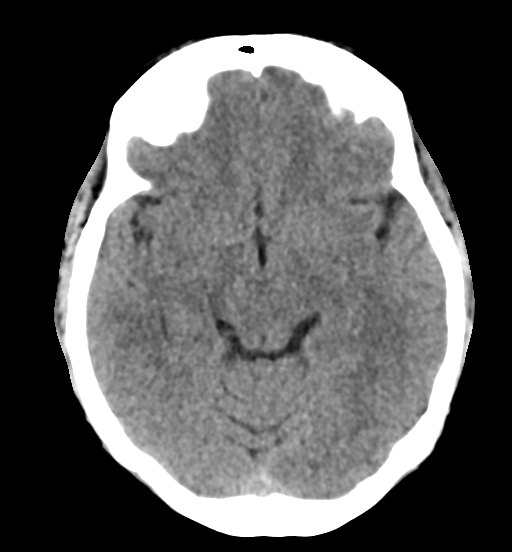
[im 17/34  brain]
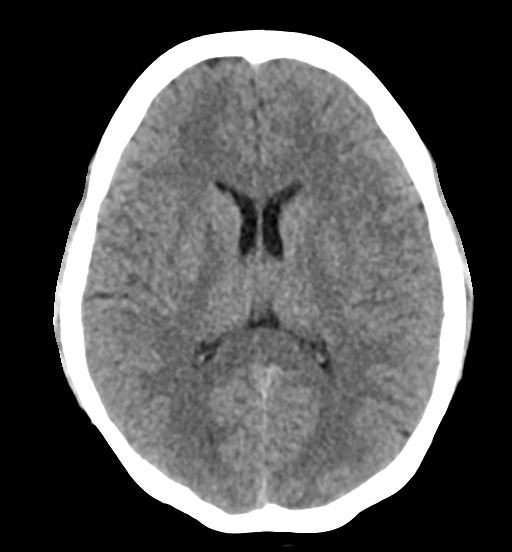
[im 21/34  brain]
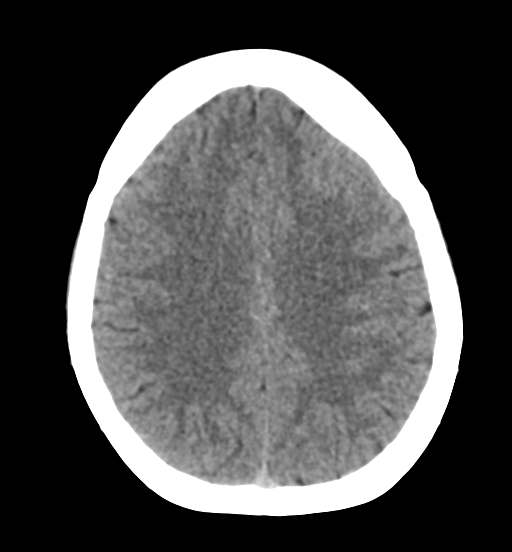
[im 21/34  bone]
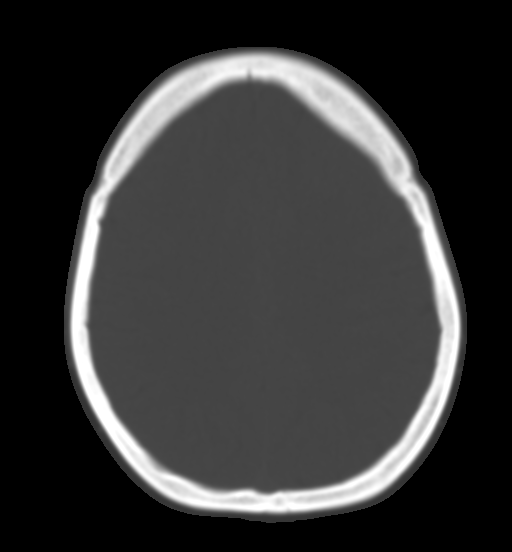
[im 25/34  brain]
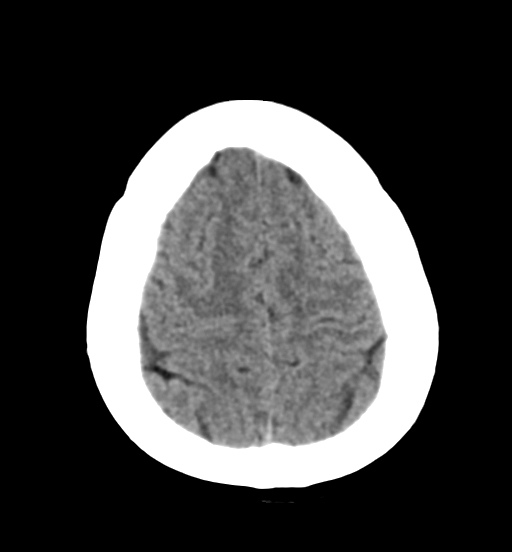
[im 29/34  brain]
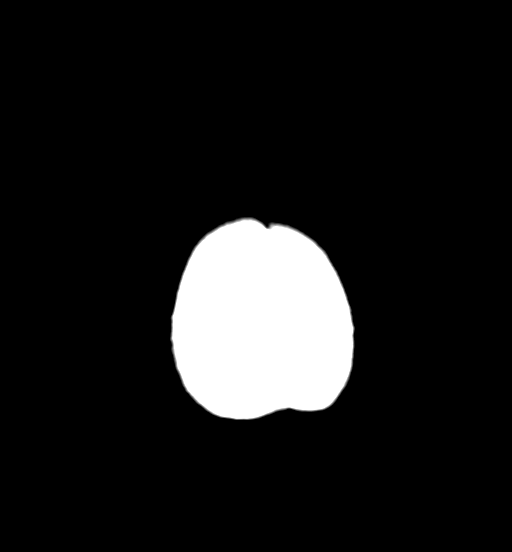

[Series 3: head bone · axial · 0.43mm/px · z∈[-152,-96]mm · 4 of 81 slices shown]
[im 9/81  bone]
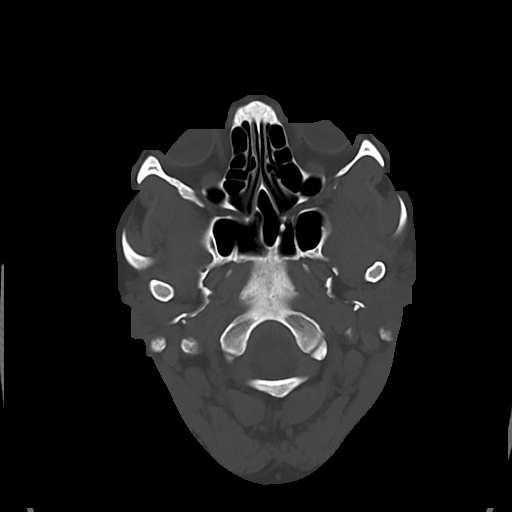
[im 17/81  bone]
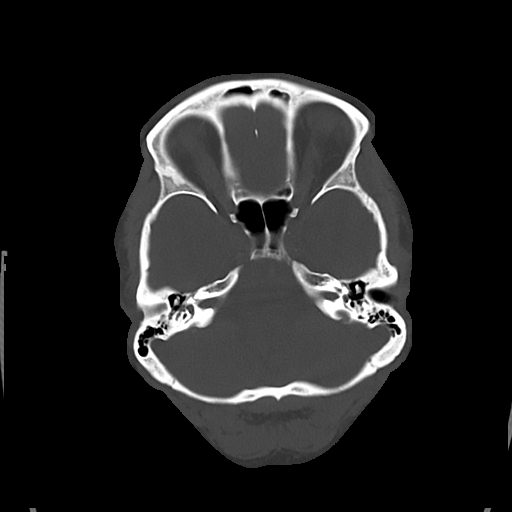
[im 25/81  bone]
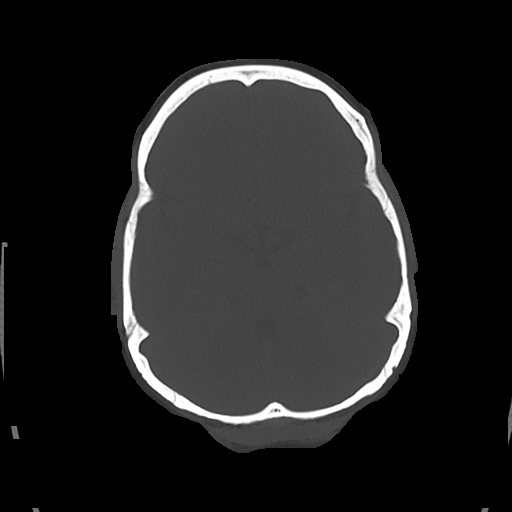
[im 37/81  bone]
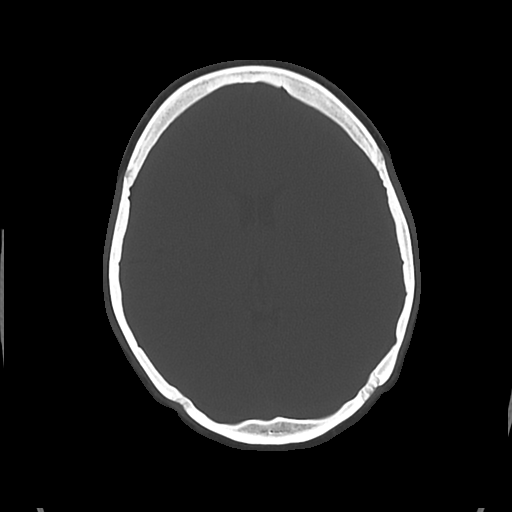

[Series 4: cor soft · coronal · 0.30mm/px · 3 of 58 slices shown]
[im 20/58  brain]
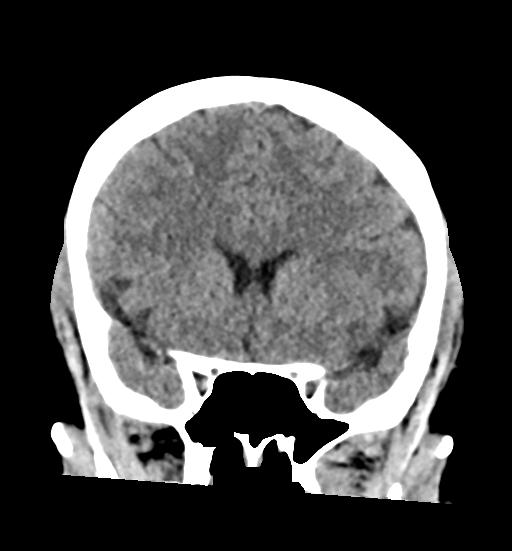
[im 26/58  brain]
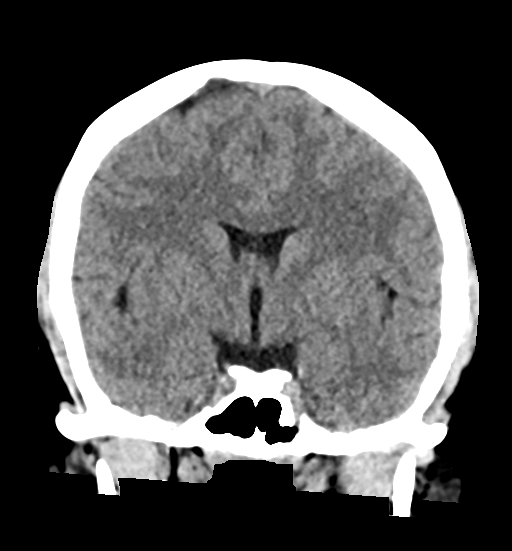
[im 32/58  brain]
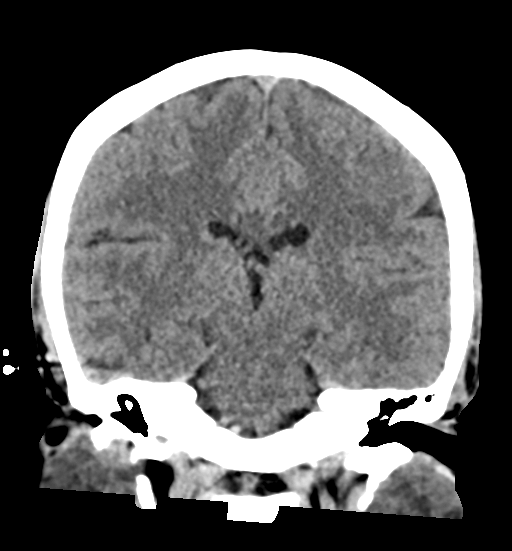

[Series 5: sag soft · sagittal · 0.33mm/px · 3 of 52 slices shown]
[im 18/52  brain]
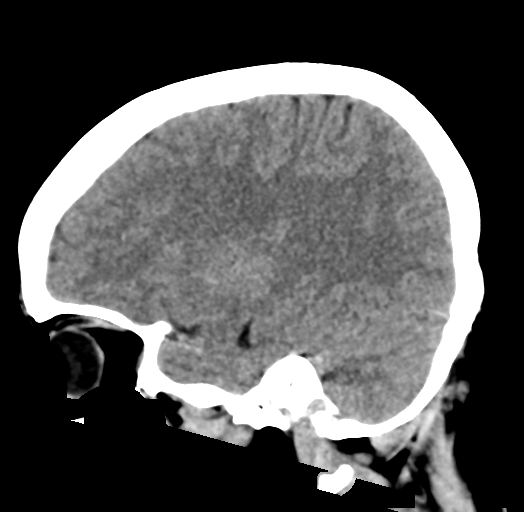
[im 26/52  brain]
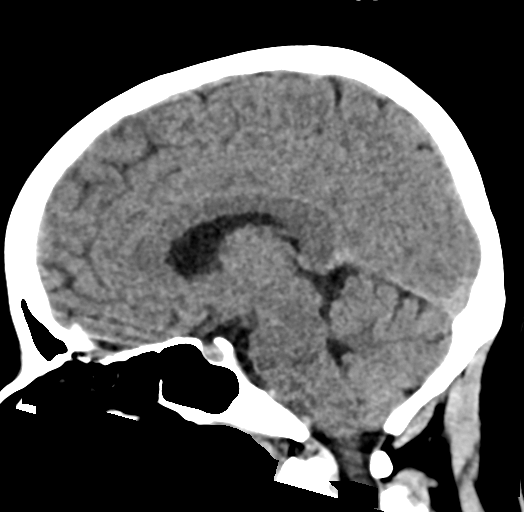
[im 35/52  brain]
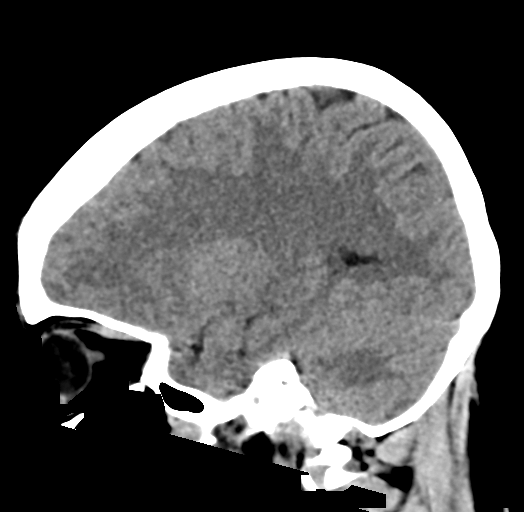

[17 of 47 positions shown; findings below may reference images not displayed]

FINDINGS: Brain: No evidence of acute infarction, hemorrhage, hydrocephalus,
extra-axial collection or mass lesion/mass effect.

Vascular: No hyperdense vessel or unexpected calcification.

Skull: Normal. Negative for fracture or focal lesion.

Sinuses/Orbits: No acute finding.

Other: None.
IMPRESSION: Normal head CT without contrast for age

## 2022-05-12 MED ORDER — SODIUM CHLORIDE 0.9 % IV BOLUS
1000.0000 mL | Freq: Once | INTRAVENOUS | Status: AC
  Administered 2022-05-12: 1000 mL via INTRAVENOUS

## 2022-05-12 MED ORDER — PROCHLORPERAZINE EDISYLATE 10 MG/2ML IJ SOLN
10.0000 mg | Freq: Once | INTRAMUSCULAR | Status: AC
  Administered 2022-05-12: 10 mg via INTRAVENOUS
  Filled 2022-05-12: qty 2

## 2022-05-12 MED ORDER — DIPHENHYDRAMINE HCL 50 MG/ML IJ SOLN
25.0000 mg | Freq: Once | INTRAMUSCULAR | Status: AC
  Administered 2022-05-12: 25 mg via INTRAVENOUS
  Filled 2022-05-12: qty 1

## 2022-05-12 NOTE — ED Triage Notes (Signed)
Spanish translator used for triage. Pt states she has been feeling sick with a headache x 2 weeks. Pt seen for same at PCP and was referred to a neurologist.  Pt states pain has worsened and she passed out at work today. Pt states she does not know how long she lost consciousness, her coworkers. Pt denies injury from fall. Pt denies nausea/vomiting. Pt A&Ox4, NAD noted.

## 2022-05-12 NOTE — ED Notes (Signed)
EDP at bedside  

## 2022-05-12 NOTE — ED Provider Notes (Signed)
[facility]-EMERGENCY DEPT [facility] Emergency Department Provider Note MRN:  [id number]  Arrival date & time: 05/13/22     Chief Complaint   Headache   History of Present Illness   Linda Snyder is a 31 y.o. year-old female with no pertinent past medical history presenting to the ED with chief complaint of headache.  2 weeks of constant headache, right-sided, moderate to severe, associated with generalized weakness, lightheadedness as well as right-sided neck pain, right arm and leg paresthesias/subjective weakness.  Review of Systems  A thorough review of systems was obtained and all systems are negative except as noted in the HPI and PMH.   Patient's Health History    Past Medical History:  Diagnosis Date   Medical history non-contributory     Past Surgical History:  Procedure Laterality Date   NO PAST SURGERIES      History reviewed. No pertinent family history.  Social History   Socioeconomic History   Marital status: Single    Spouse name: Not on file   Number of children: Not on file   Years of education: Not on file   Highest education level: Not on file  Occupational History   Not on file  Tobacco Use   Smoking status: Never   Smokeless tobacco: Never  Vaping Use   Vaping Use: Never used  Substance and Sexual Activity   Alcohol use: No   Drug use: No   Sexual activity: Not on file  Other Topics Concern   Not on file  Social History Narrative   Not on file   Social Determinants of Health   Financial Resource Strain: Not on file  Food Insecurity: Not on file  Transportation Needs: Not on file  Physical Activity: Not on file  Stress: Not on file  Social Connections: Not on file  Intimate Partner Violence: Not on file     Physical Exam   Vitals:   05/13/22 0605 05/13/22 0610  BP: 103/70   Pulse: 83 65  Resp: 17 16  Temp:    SpO2: 98% 98%    CONSTITUTIONAL: Well-appearing, NAD NEURO/PSYCH:  Alert and oriented x 3, subjective  decreased sensation and weakness to the right arm and upper right leg EYES:  eyes equal and reactive ENT/NECK:  no LAD, no JVD CARDIO: Regular rate, well-perfused, normal S1 and S2 PULM:  CTAB no wheezing or rhonchi GI/GU:  non-distended, non-tender MSK/SPINE:  No gross deformities, no edema SKIN:  no rash, atraumatic   *Additional and/or pertinent findings included in MDM below  Diagnostic and Interventional Summary    EKG Interpretation  Date/Time:  Saturday May 12 2022 16:03:08 EDT Ventricular Rate:  86 PR Interval:  120 QRS Duration: 86 QT Interval:  356 QTC Calculation: 426 R Axis:   75 Text Interpretation: Normal sinus rhythm Normal ECG No previous ECGs available Confirmed by Kennis Carina [phone]) on 05/12/2022 11:09:45 PM       Labs Reviewed  COMPREHENSIVE METABOLIC PANEL - Abnormal; Notable for the following components:      Result Value   Glucose, Bld 104 (*)    All other components within normal limits  TSH - Abnormal; Notable for the following components:   TSH 10.092 (*)    All other components within normal limits  CBC WITH DIFFERENTIAL/PLATELET  CK  SEDIMENTATION RATE  C-REACTIVE PROTEIN  I-STAT BETA HCG BLOOD, ED ([facility], [facility], [facility] ONLY)    CT ANGIO HEAD NECK W WO CM  Final Result    CT  HEAD WO CONTRAST  Final Result    MR Brain W and Wo Contrast    (Results Pending)  MR CERVICAL SPINE W WO CONTRAST    (Results Pending)  MR MRV HEAD WO CM    (Results Pending)    Medications  diphenhydrAMINE (BENADRYL) injection 25 mg (25 mg Intravenous Given 05/12/22 2348)  prochlorperazine (COMPAZINE) injection 10 mg (10 mg Intravenous Given 05/12/22 2348)  sodium chloride 0.9 % bolus 1,000 mL (0 mLs Intravenous Stopped 05/13/22 0248)  iohexol (OMNIPAQUE) 350 MG/ML injection 75 mL (75 mLs Intravenous Contrast Given 05/13/22 0033)     Procedures  /  Critical Care .Foreign Body Removal  Date/Time: 05/13/2022 6:53 AM  Performed by: Sabas Sous, MD Authorized by:  Sabas Sous, MD  Consent: Verbal consent obtained. Risks and benefits: risks, benefits and alternatives were discussed Consent given by: patient Patient understanding: patient states understanding of the procedure being performed Imaging studies: imaging studies available Patient identity confirmed: verbally with patient Time out: Immediately prior to procedure a "time out" was called to verify the correct patient, procedure, equipment, support staff and site/side marked as required. Body area: ear Location details: right ear Patient cooperative: yes Localization method: visualized Removal mechanism: forceps Complexity: simple 2 objects recovered. Objects recovered: earrings Post-procedure assessment: foreign body removed Patient tolerance: patient tolerated the procedure well with no immediate complications Comments: Earrings removed from right ear to facilitate MRI.  Patient and MRI techs unable to remove them.  Forceps required.    ED Course and Medical Decision Making  Initial Impression and Ddx Differential diagnosis includes complex migraine, MS, venous dural thrombus, carotid dissection.  Awaiting CT, MRI.  Providing migraine cocktail.  Past medical/surgical history that increases complexity of ED encounter: None  Interpretation of Diagnostics I personally reviewed the laboratory evaluation and my interpretation is as follows: No significant blood count or electrolyte disturbance  CTA imaging is reassuring, awaiting MRI  Patient Reassessment and Ultimate Disposition/Management     Delay in MRI due to embedded earrings, which I needed to remove with forceps, see details above.  Signed out to oncoming provider.  Neurology has evaluated the patient, no need for MRI of the T or L-spine per Dr. Roda Shutters.  Patient management required discussion with the following services or consulting groups:  Neurology  Complexity of Problems Addressed Acute illness or injury that poses  threat of life of bodily function  Additional Data Reviewed and Analyzed Further history obtained from: Further history from spouse/family member  Additional Factors Impacting ED Encounter Risk Consideration of hospitalization  Elmer Sow. Pilar Plate, MD [facility] Emergency Medicine [facility] [email or web address]  Final Clinical Impressions(s) / ED Diagnoses     ICD-10-CM   1. Acute nonintractable headache, unspecified headache type  R51.9     2. Neurological symptoms  R29.90       ED Discharge Orders     None        Discharge Instructions Discussed with and Provided to Patient:   Discharge Instructions   None      Sabas Sous, MD 05/13/22 [phone]

## 2022-05-12 NOTE — ED Provider Triage Note (Signed)
Emergency Medicine Provider Triage Evaluation Note  Linda Snyder , a 31 y.o. female  was evaluated in triage.  Pt complains of headache that has been ongoing for several weeks.  Evaluated by PCP started on sumatriptan, and amitriptyline.  She has been taking this medication but there has not been any improvement.  Continues to complain of a sharp sensation to the right head, right arm, entire right side of her body.  She reports she was at work today when suddenly she felt very weak and that the pain was so severe that it caused her to collapse, there was loss of consciousness, she does not know how long she was down for.  She does have a referral to follow-up with [facility] neurology, they gave her an appointment in the month of November.  PCP note reveals concern for MS versus chronic migraine with aura. LMP 5/20 Review of Systems  Positive: Headache, syncope Negative: Vision changes, neck pain  Physical Exam  BP (!) 126/95 (BP Location: Right Arm)   Pulse 87   Temp 98.7 F (37.1 C) (Oral)   Resp 16   SpO2 100%  Gen:   Awake, no distress   Resp:  Normal effort  MSK:   Moves extremities without difficulty  Other:  Neurologically intact.  Medical Decision Making  Medically screening exam initiated at 4:00 PM.  Appropriate orders placed.  Joanne Chars Sophronia Simas was informed that the remainder of the evaluation will be completed by another provider, this initial triage assessment does not replace that evaluation, and the importance of remaining in the ED until their evaluation is complete.  PCP with concerns for MS versus chronic migraine with aura.  Currently taking sumatriptan and amitriptyline.   Janeece Fitting, PA-C 05/12/22 1615

## 2022-05-13 ENCOUNTER — Emergency Department (HOSPITAL_COMMUNITY): Payer: Self-pay

## 2022-05-13 DIAGNOSIS — G43901 Migraine, unspecified, not intractable, with status migrainosus: Secondary | ICD-10-CM

## 2022-05-13 LAB — SEDIMENTATION RATE: Sed Rate: 2 mm/hr (ref 0–22)

## 2022-05-13 LAB — T4, FREE: Free T4: 0.95 ng/dL (ref 0.61–1.12)

## 2022-05-13 LAB — CK: Total CK: 91 U/L (ref 38–234)

## 2022-05-13 LAB — TSH: TSH: 10.092 u[IU]/mL — ABNORMAL HIGH (ref 0.350–4.500)

## 2022-05-13 LAB — C-REACTIVE PROTEIN: CRP: 0.6 mg/dL (ref <1.0)

## 2022-05-13 IMAGING — MR MR CERVICAL SPINE WO/W CM
5 of 8 series · 19 of 48 positions shown · IV contrast (gadavist)
Comparison: None Available.

CLINICAL DATA: Demyelinating disease.  Right temporal headaches.

EXAM:
MRI CERVICAL SPINE WITHOUT AND WITH CONTRAST
TECHNIQUE: Multiplanar and multiecho pulse sequences of the cervical spine, to
include the craniocervical junction and cervicothoracic junction,
were obtained without and with intravenous contrast.
CONTRAST:  6.5mL GADAVIST GADOBUTROL 1 MMOL/ML IV SOLN

[Series 11: T2 · sagittal · 3.0mm · 0.35mm/px · 3 of 14 slices shown (1 of 2)]
[im 1/14]
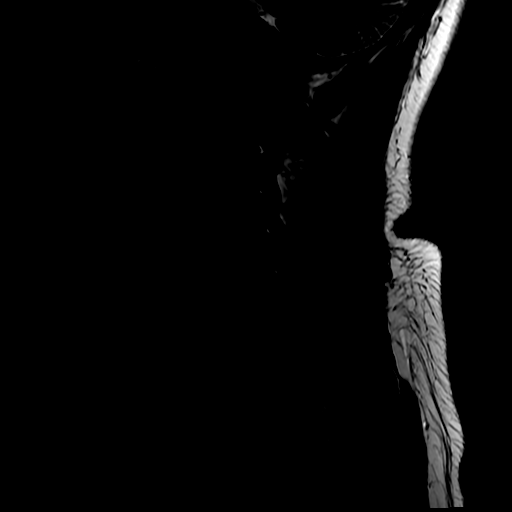
[im 7/14]
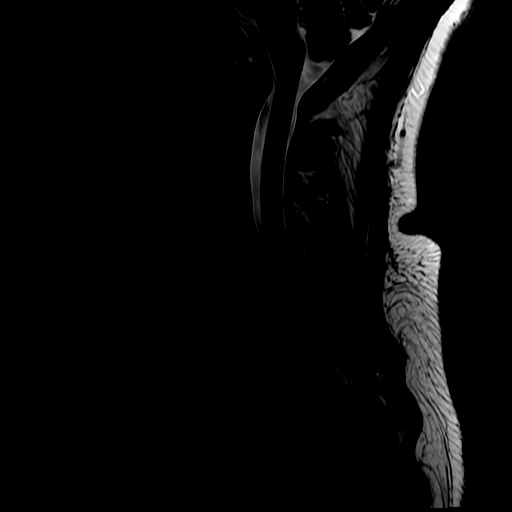
[im 14/14]
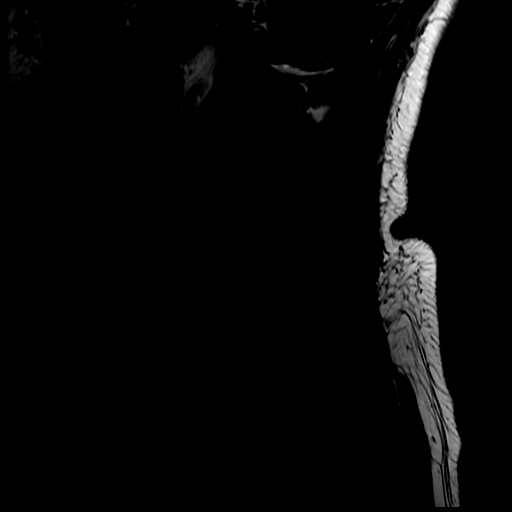

[Series 13: STIR · sagittal · 3.0mm · 0.35mm/px · 1 of 14 slices shown]
[im 1/14]
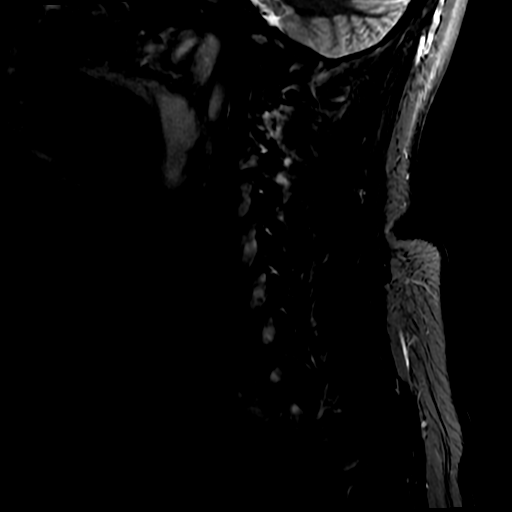

[Series 15: T2 · axial · 3.0mm · 0.35mm/px · z∈[-158,-54]mm · 6 of 34 slices shown (2 of 2)]
[im 1/34]
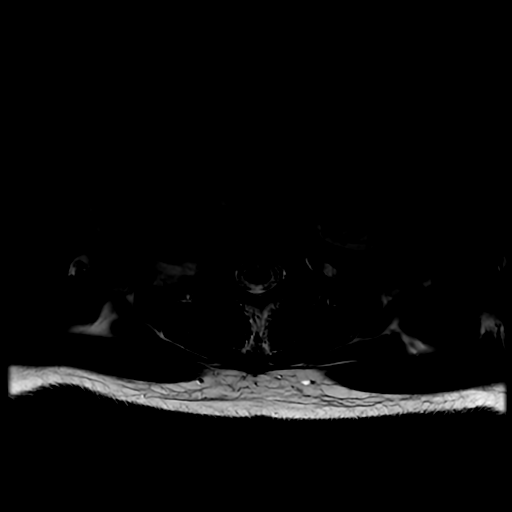
[im 7/34]
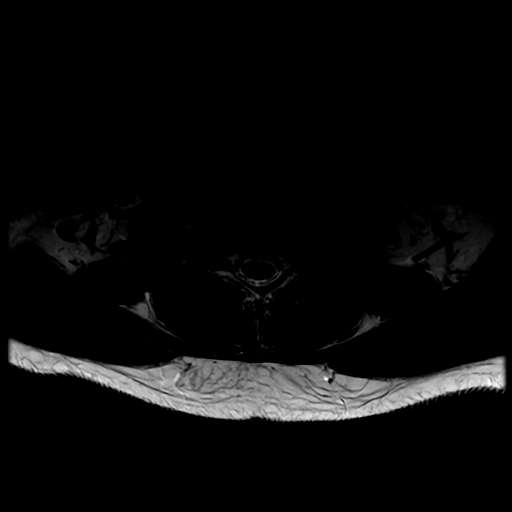
[im 14/34]
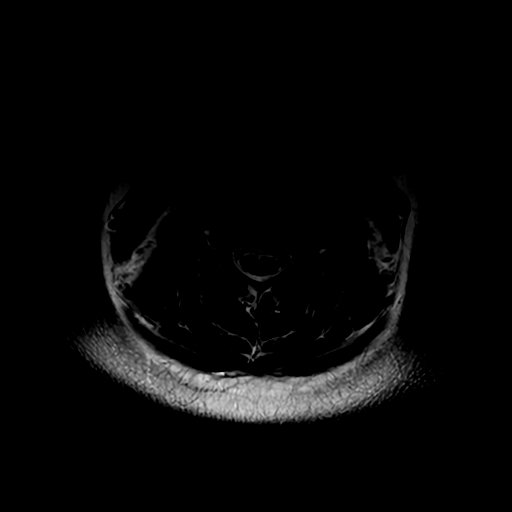
[im 20/34]
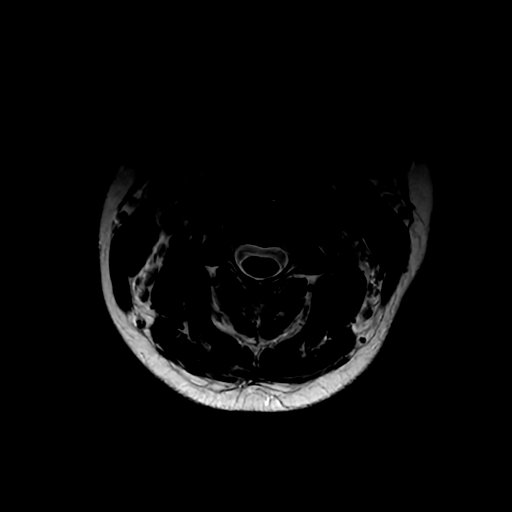
[im 27/34]
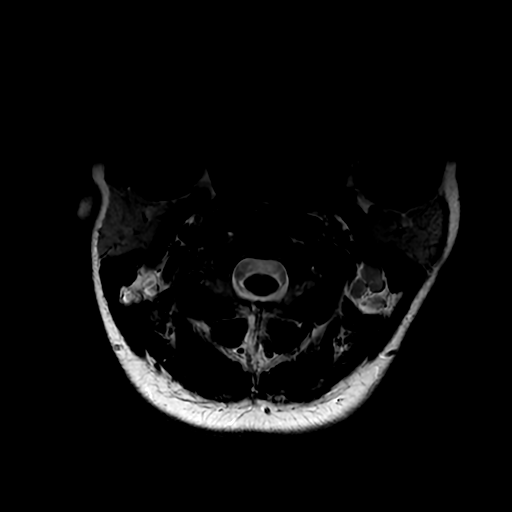
[im 34/34]
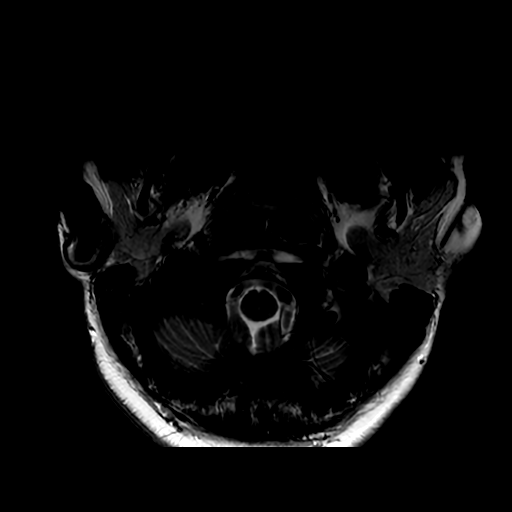

[Series 16: T1 · axial · non-contrast · 3.0mm · 0.35mm/px · z∈[-158,-54]mm · 6 of 34 slices shown]
[im 1/34]
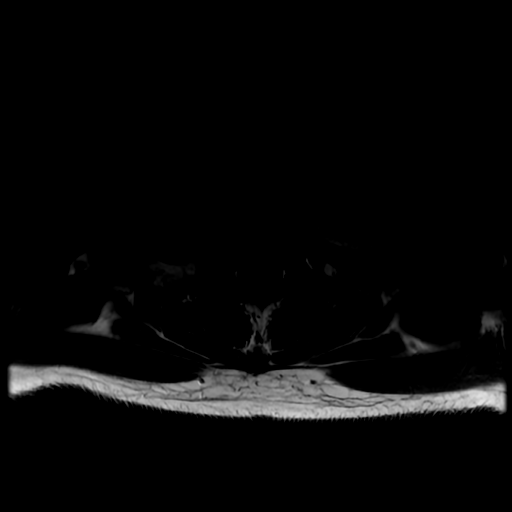
[im 7/34]
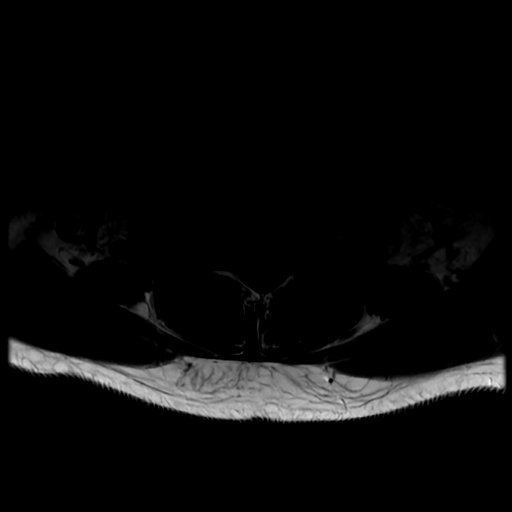
[im 14/34]
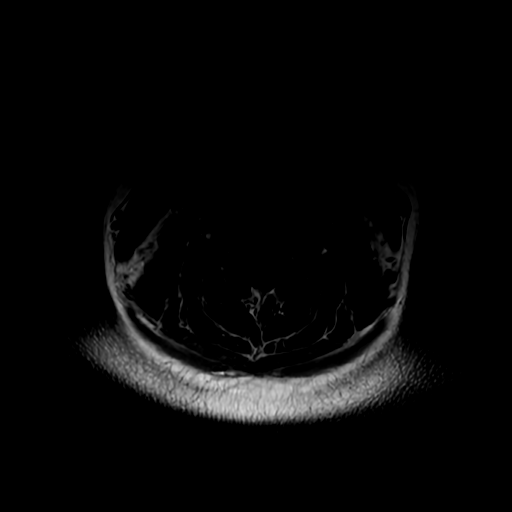
[im 20/34]
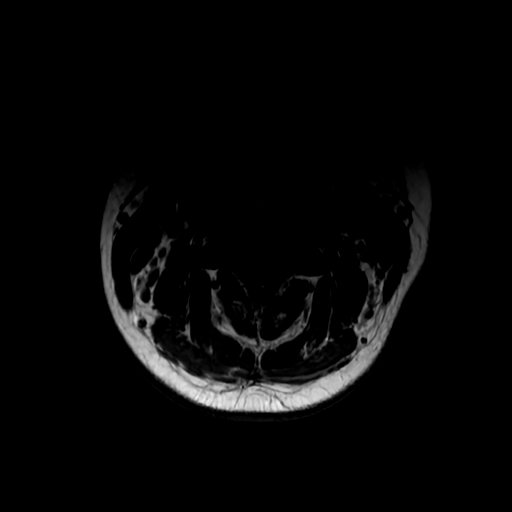
[im 27/34]
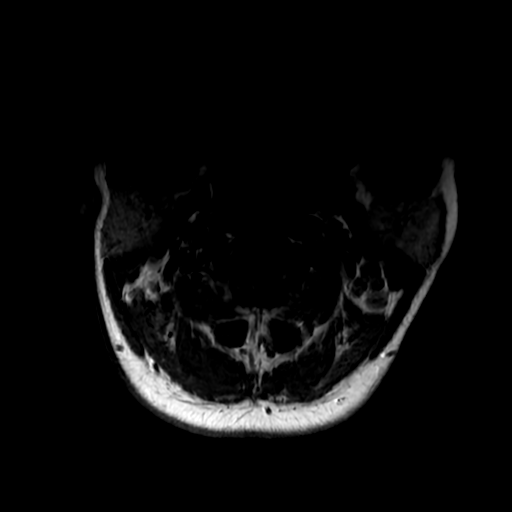
[im 34/34]
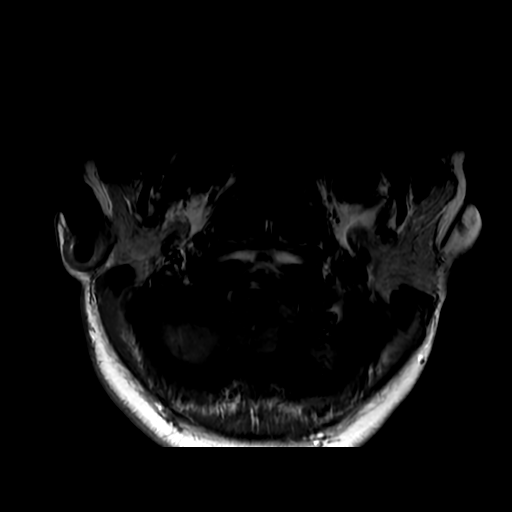

[Series 23: T1 fat-sat post-contrast · sagittal · 3.0mm · 0.35mm/px · 3 of 14 slices shown]
[im 1/14]
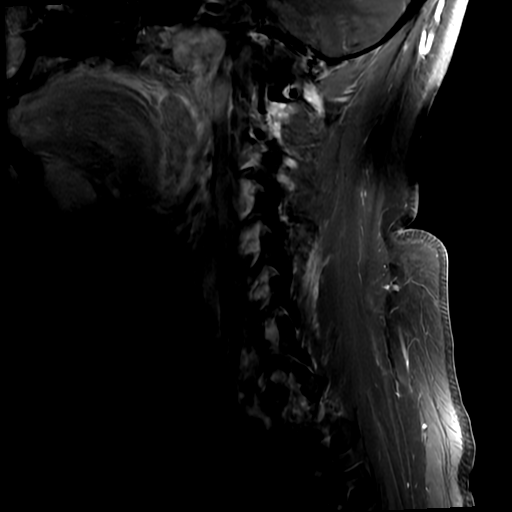
[im 7/14]
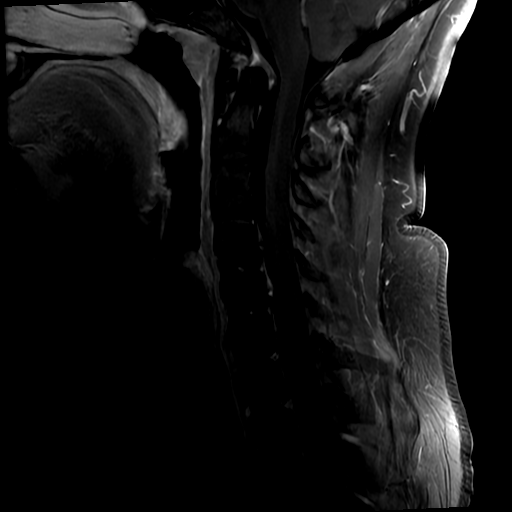
[im 14/14]
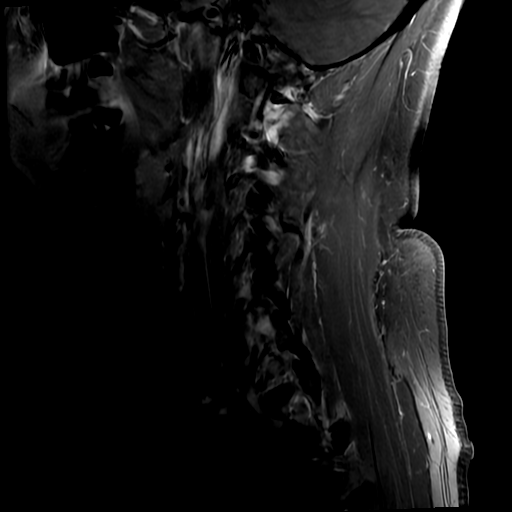

[19 of 48 positions shown; findings below may reference images not displayed]

FINDINGS: Alignment: Physiologic

Vertebrae: No fracture, evidence of discitis, or bone lesion.

Cord: Normal signal and morphology.

Posterior Fossa, vertebral arteries, paraspinal tissues: Negative.

Disc levels:

Tiny disc protrusion with annular fissure at C5-6. Minor C4-5 disc
bulging. No neural impingement.
IMPRESSION: Normal MRI of the cervical cord. No inflammation or impingement to
explain headache.

## 2022-05-13 IMAGING — MR MR HEAD WO/W CM
10 of 17 series · 23 of 48 positions shown · IV contrast (gadavist)
Comparison: Head CT from yesterday and CTA from earlier today

CLINICAL DATA: Demyelinating disease, right-sided temporal
headaches

EXAM:
MRI HEAD WITHOUT AND WITH CONTRAST
TECHNIQUE: Multiplanar, multiecho pulse sequences of the brain and surrounding
structures were obtained without and with intravenous contrast.
CONTRAST:  6.5mL GADAVIST GADOBUTROL 1 MMOL/ML IV SOLN

[Series 2: FLAIR · sagittal · 5.0mm · 0.47mm/px · 1 of 23 slices shown (1 of 3)]
[im 1/23]
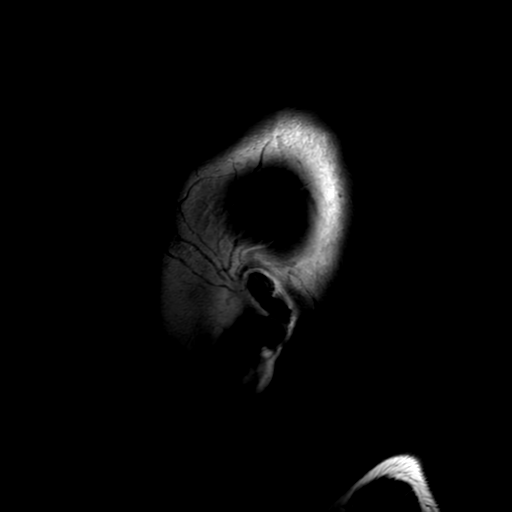

[Series 3: DWI · axial · 3.6mm · 0.94mm/px · z∈[-65,+72]mm · 3 of 80 slices shown (1 of 2)]
[im 1/80]
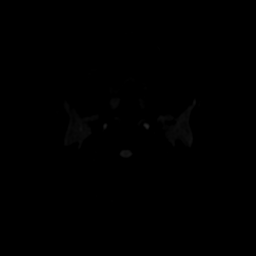
[im 40/80]
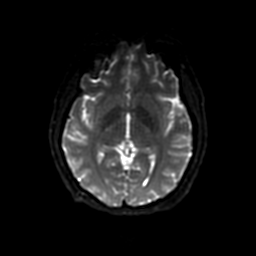
[im 80/80]
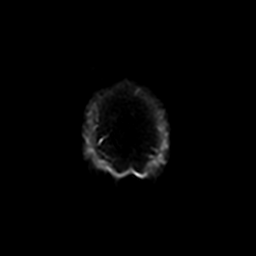

[Series 4: T2 · axial · 5.0mm · 0.23mm/px · 1 of 24 slices shown]
[im 1/24]
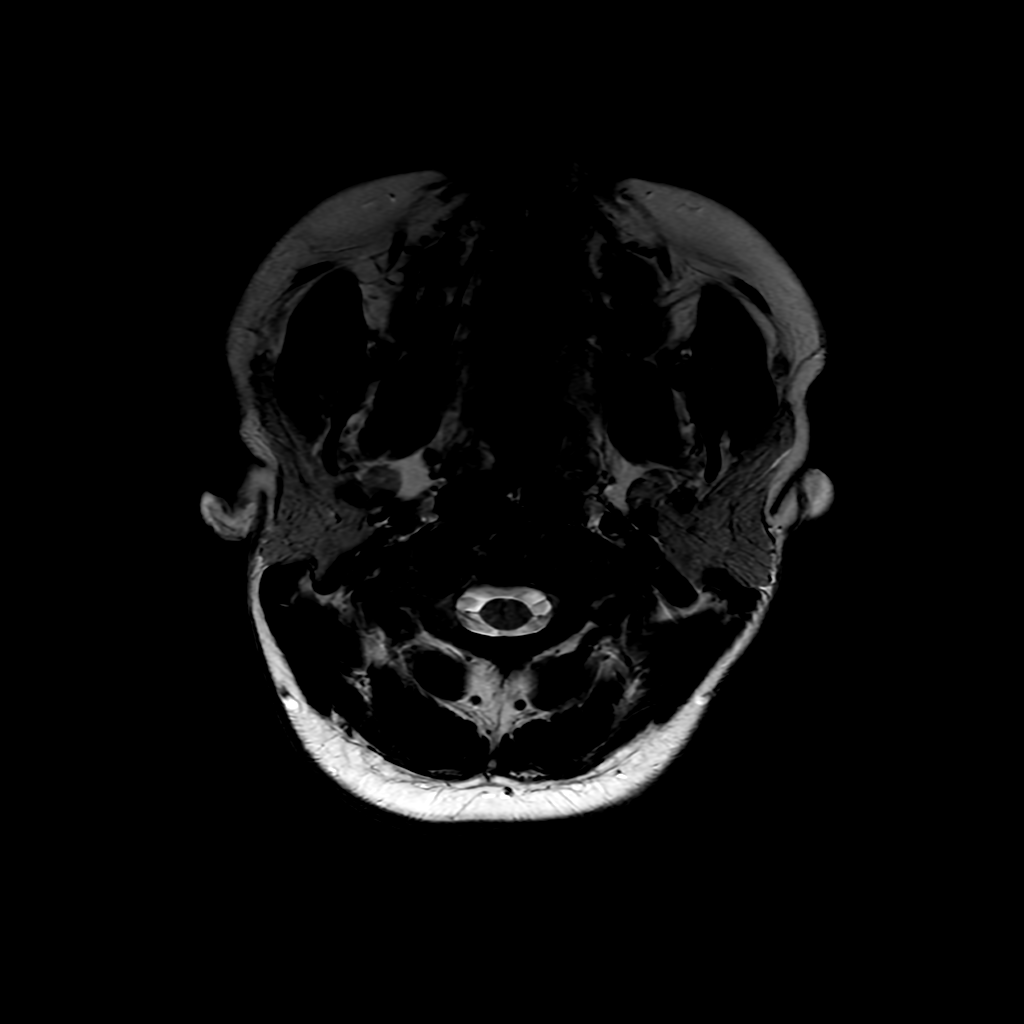

[Series 5: FLAIR · axial · 3.0mm · 0.45mm/px · 1 of 24 slices shown (2 of 3)]
[im 1/24]
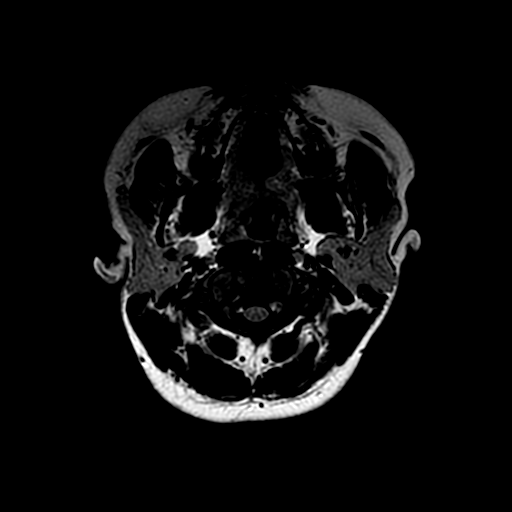

[Series 6: FLAIR · sagittal · 1.6mm · 0.45mm/px · 9 of 212 slices shown (3 of 3)]
[im 1/212]
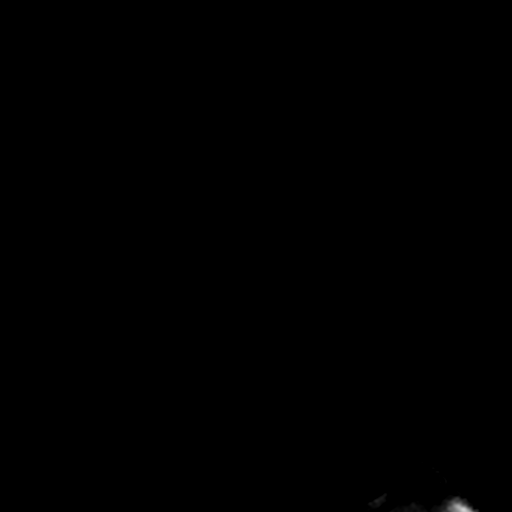
[im 27/212]
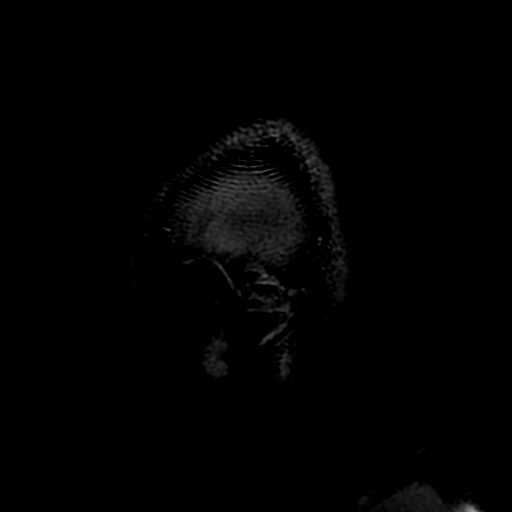
[im 53/212]
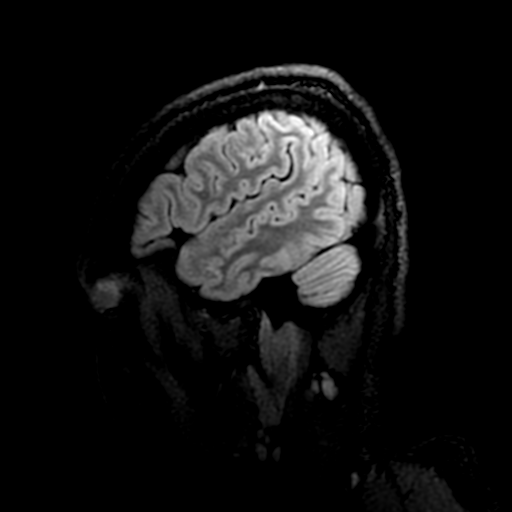
[im 80/212]
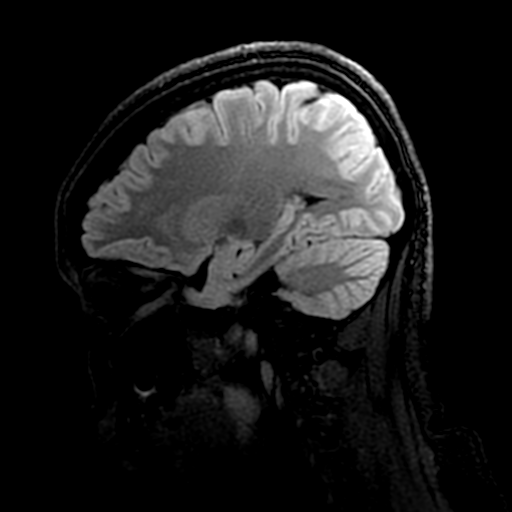
[im 106/212]
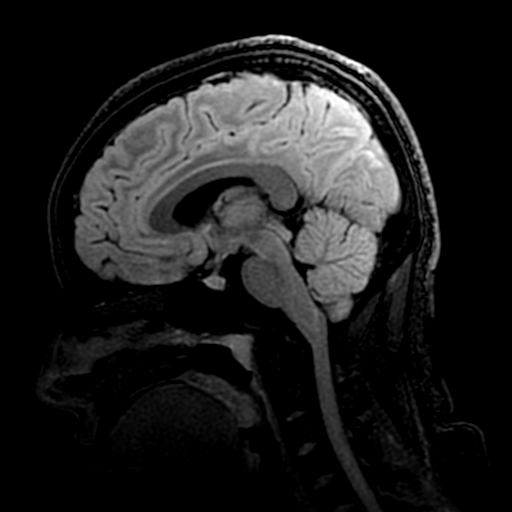
[im 132/212]
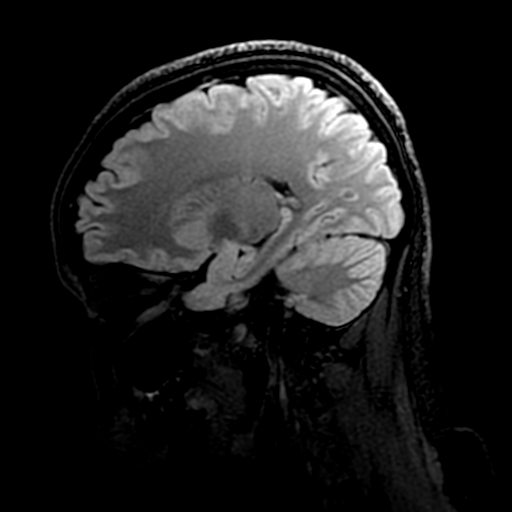
[im 159/212]
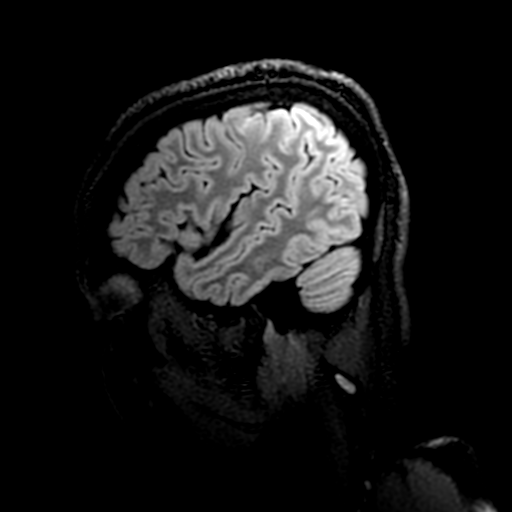
[im 185/212]
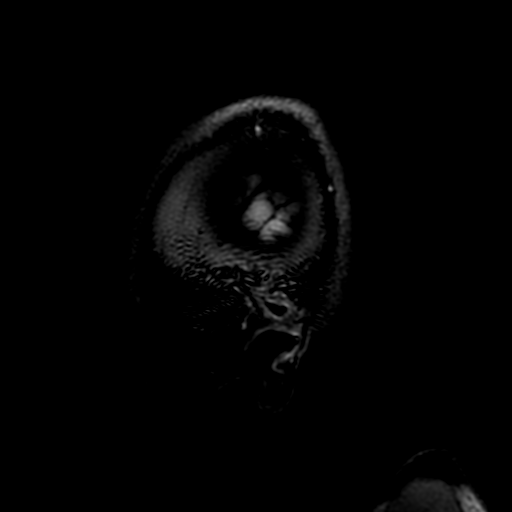
[im 212/212]
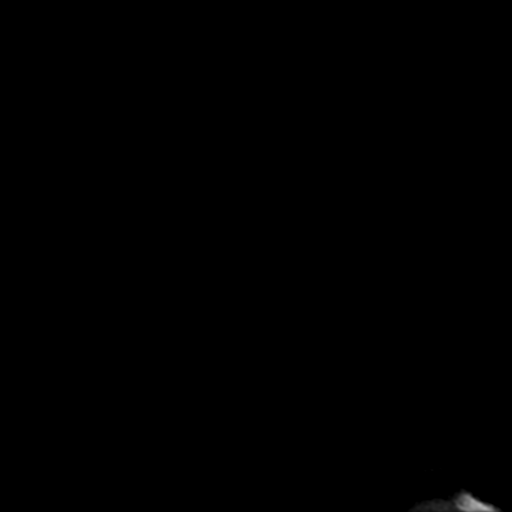

[Series 7: DWI · coronal · 5.0mm · 0.94mm/px · 3 of 64 slices shown (2 of 2)]
[im 1/64]
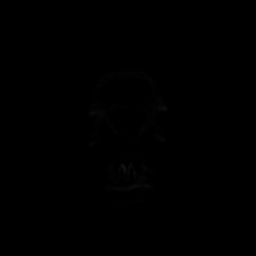
[im 32/64]
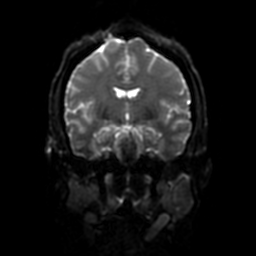
[im 64/64]
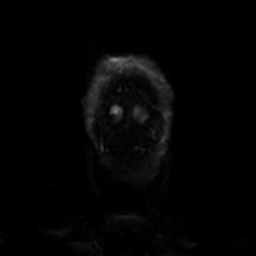

[Series 19: T2 post-contrast · coronal · 5.0mm · 0.39mm/px · 1 of 27 slices shown]
[im 1/27]
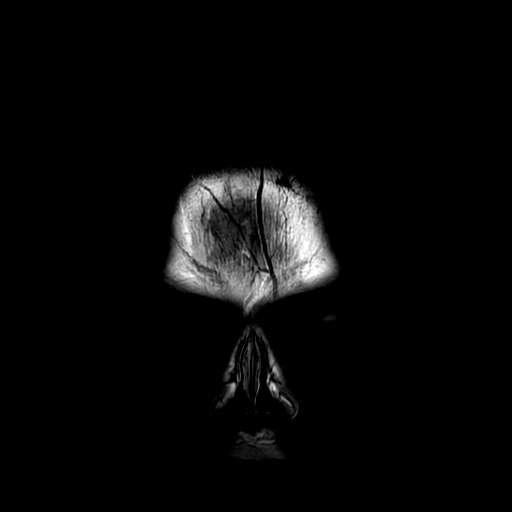

[Series 22: T1 post-contrast · coronal · 5.0mm · 0.39mm/px · 1 of 27 slices shown]
[im 1/27]
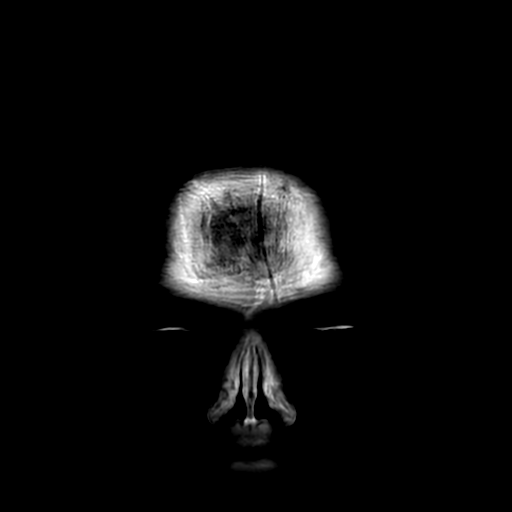

[Series 350: ADC · axial · 3.6mm · 0.94mm/px · z∈[-65,+72]mm · 2 of 40 slices shown (1 of 2)]
[im 1/40]
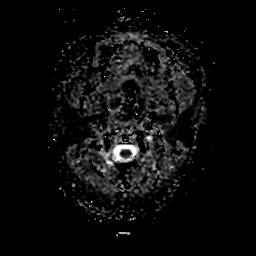
[im 40/40]
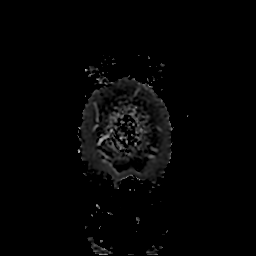

[Series 750: ADC · coronal · 5.0mm · 0.94mm/px · 1 of 32 slices shown (2 of 2)]
[im 1/32]
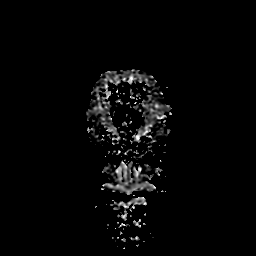

[23 of 48 positions shown; findings below may reference images not displayed]

FINDINGS: Brain: No acute infarction, hemorrhage, hydrocephalus, extra-axial
collection or mass lesion. No white matter disease or atrophy

Vascular: Normal flow voids and vessel enhancements

Skull and upper cervical spine: Normal marrow signal

Sinuses/Orbits: Negative
IMPRESSION: Normal brain MRI.

## 2022-05-13 MED ORDER — IBUPROFEN 600 MG PO TABS: 600.0000 mg | ORAL_TABLET | Freq: Four times a day (QID) | ORAL | 0 refills | Status: AC | PRN

## 2022-05-13 MED ORDER — IOHEXOL 350 MG/ML SOLN
75.0000 mL | Freq: Once | INTRAVENOUS | Status: AC | PRN
  Administered 2022-05-13: 75 mL via INTRAVENOUS

## 2022-05-13 MED ORDER — GADOBUTROL 1 MMOL/ML IV SOLN
6.5000 mL | Freq: Once | INTRAVENOUS | Status: AC | PRN
  Administered 2022-05-13: 6.5 mL via INTRAVENOUS

## 2022-05-13 NOTE — ED Provider Notes (Signed)
Care of patient assumed from Dr. Pilar Plate at 7:30 AM.  This patient presents with 2 weeks of headache and subjective right-sided neurologic symptoms.  Neurology was spoken with and they recommend MRI of brain and cervical spine for possible MS.  If imaging studies are negative, patient can be discharged. Physical Exam  BP 98/68   Pulse 66   Temp 98.7 F (37.1 C) (Oral)   Resp 17   Ht 5\' 4"  (1.626 m)   Wt 61.2 kg   LMP 04/21/2022 (Approximate)   SpO2 99%   BMI 23.17 kg/m   Physical Exam Vitals and nursing note reviewed.  Constitutional:      General: She is not in acute distress.    Appearance: She is well-developed and normal weight. She is not ill-appearing, toxic-appearing or diaphoretic.  HENT:     Head: Normocephalic and atraumatic.  Eyes:     General: No scleral icterus.    Extraocular Movements: Extraocular movements intact.     Conjunctiva/sclera: Conjunctivae normal.  Cardiovascular:     Rate and Rhythm: Normal rate and regular rhythm.  Pulmonary:     Effort: Pulmonary effort is normal. No respiratory distress.  Abdominal:     General: There is no distension.     Palpations: Abdomen is soft.  Musculoskeletal:        General: No swelling. Normal range of motion.     Cervical back: Normal range of motion and neck supple. No rigidity.  Skin:    General: Skin is warm and dry.  Neurological:     Mental Status: She is alert and oriented to person, place, and time.     Cranial Nerves: No cranial nerve deficit or facial asymmetry.     Sensory: No sensory deficit.     Motor: No weakness.     Coordination: Coordination normal.  Psychiatric:        Mood and Affect: Mood normal.        Speech: Speech normal.        Behavior: Behavior normal.     Procedures  Procedures  ED Course / MDM    Medical Decision Making Amount and/or Complexity of Data Reviewed Radiology: ordered.  Risk Prescription drug management.   On assessment, patient resting comfortably.  Patient  interview performed with hospital tele interpreter.  Patient reports current resolution of headache.  She was informed of her normal MRI findings.  She states that these headaches have been ongoing.  They are intermittent.  She has a primary care doctor who has prescribed her daily amitriptyline as well as as needed sumatriptan.  She states that the sumatriptan does not help her.  On patient's lab work, her TSH was elevated, however, T4 was normal.  Patient's symptoms may be secondary to IIH.  Given resolution of symptoms at this time, patient does not need any further work-up in the ED.  She does have a primary care doctor that she states she will be able to contact for close follow-up.  Patient was also advised to try NSAIDs for headache relief as needed.  Patient was discharged in good condition.       04/23/2022, MD 05/13/22 1311

## 2022-05-13 NOTE — ED Notes (Signed)
Pt returned from MRI °

## 2022-05-13 NOTE — ED Notes (Signed)
Pt to MRI

## 2022-05-13 NOTE — Consult Note (Signed)
Stroke Neurology Consultation Note  Consult Requested by: Dr. Sedonia Small  Reason for Consult: right sided HA, right arm and leg proximal weakness vs. tingling  Consult Date: 05/13/22   The history was obtained from the pt and her husband.  During history and examination, all items were able to obtain unless otherwise noted.  History of Present Illness:  Linda Snyder is a 31 y.o. Hispanic female with PMH of HTN presented to ED for right sided HA for 2 weeks.   Per patient and husband, patient had right-sided headache for last 2 weeks, throbbing pain at right parietal area, sometimes more severe and sometimes later, however no significant triggering factors.  Denies any nausea vomiting or phonophobia, however photophobia.  She was put on amitriptyline 10 mg daily at night and Imitrex as needed.  Medication seems to help a little bit but no significant relief.  She was scheduled to see Dr. Tomi Likens in 10/2022. Per husband, patient normally may have headache once per months but short lived, this time headache lasting 2 weeks is unusual for her.  Yesterday she was at work and started to have worsening right-sided headache, and feeling dizzy and then she passed out.  Not sure how long she passed out.  Patient also complaining of right shoulder, arm and right thigh feeling weak, tingling, "at sleep" for the last 2 weeks.  He denies any weakness or numbness at forearms, hands or feet.    Past Medical History:  Diagnosis Date   Medical history non-contributory     Past Surgical History:  Procedure Laterality Date   NO PAST SURGERIES      History reviewed. No pertinent family history.  Social History:  reports that she has never smoked. She has never used smokeless tobacco. She reports that she does not drink alcohol and does not use drugs.  Allergies: No Known Allergies  No current facility-administered medications on file prior to encounter.   Current Outpatient Medications on File Prior to  Encounter  Medication Sig Dispense Refill   acetaminophen (TYLENOL) 500 MG tablet Take 1,000 mg by mouth every 6 (six) hours as needed for moderate pain or headache.     amitriptyline (ELAVIL) 10 MG tablet Take 10 mg by mouth at bedtime.     OVER THE COUNTER MEDICATION Take 1 Bottle by mouth daily. Mix with orange juice and drink every morning Neurobion-memory supplement from [place]     SUMAtriptan (IMITREX) 25 MG tablet Take 25 mg by mouth every 4 (four) hours as needed for migraine or headache.      Review of Systems: A full ROS was attempted today and was able to be performed.  Systems assessed include - Constitutional, Eyes, HENT, Respiratory, Cardiovascular, Gastrointestinal, Genitourinary, Integument/breast, Hematologic/lymphatic, Musculoskeletal, Neurological, Behavioral/Psych, Endocrine, Allergic/Immunologic - with pertinent responses as per HPI.  Physical Examination: Temp:  [98.7 F (37.1 C)] 98.7 F (37.1 C) (06/10 1558) Pulse Rate:  [73-100] 79 (06/11 0315) Resp:  [15-25] 16 (06/11 0315) BP: (99-126)/(67-95) 106/68 (06/11 0130) SpO2:  [97 %-100 %] 97 % (06/11 0315) Weight:  [61.2 kg] 61.2 kg (06/10 1604)  General - well nourished, well developed, in no apparent distress.    Ophthalmologic - fundi not visualized due to noncooperation.    Cardiovascular - regular rhythm and rate  Mental Status -  Level of arousal and orientation to time, place, and person were intact. Language including expression, naming, repetition, comprehension, reading, and writing was assessed and found intact. Fund of Knowledge was assessed and  was intact.  Cranial Nerves II - XII - II - Vision intact OU. III, IV, VI - Extraocular movements intact. V - Facial sensation intact bilaterally. VII - Facial movement intact bilaterally. VIII - Hearing & vestibular intact bilaterally. X - Palate elevates symmetrically. XI - Chin turning & shoulder shrug intact bilaterally. XII - Tongue protrusion  intact.  Motor Strength - The patient's strength was normal in all extremities and pronator drift was absent.   Motor Tone & Bulk - Muscle tone was assessed at the neck and appendages and was normal.  Bulk was normal and fasciculations were absent.   Reflexes - The patient's reflexes were normal in all extremities and she had no pathological reflexes.  Sensory - Light touch, temperature/pinprick were assessed and were normal.    Coordination - The patient had normal movements in the hands and feet with no ataxia or dysmetria.  Tremor was absent.  Gait and Station - deferred  Data Reviewed: CT ANGIO HEAD NECK W WO CM  Result Date: 05/13/2022 CLINICAL DATA:  Head headache and loss of consciousness EXAM: CT ANGIOGRAPHY HEAD AND NECK TECHNIQUE: Multidetector CT imaging of the head and neck was performed using the standard protocol during bolus administration of intravenous contrast. Multiplanar CT image reconstructions and MIPs were obtained to evaluate the vascular anatomy. Carotid stenosis measurements (when applicable) are obtained utilizing NASCET criteria, using the distal internal carotid diameter as the denominator. RADIATION DOSE REDUCTION: This exam was performed according to the departmental dose-optimization program which includes automated exposure control, adjustment of the mA and/or kV according to patient size and/or use of iterative reconstruction technique. CONTRAST:  79m OMNIPAQUE IOHEXOL 350 MG/ML SOLN COMPARISON:  None Available. FINDINGS: CTA NECK FINDINGS SKELETON: There is no bony spinal canal stenosis. No lytic or blastic lesion. OTHER NECK: Normal pharynx, larynx and major salivary glands. No cervical lymphadenopathy. Unremarkable thyroid gland. UPPER CHEST: No pneumothorax or pleural effusion. No nodules or masses. AORTIC ARCH: There is no calcific atherosclerosis of the aortic arch. There is no aneurysm, dissection or hemodynamically significant stenosis of the visualized  portion of the aorta. Aberrant right subclavian artery. The visualized proximal subclavian arteries are widely patent. RIGHT CAROTID SYSTEM: Normal without aneurysm, dissection or stenosis. LEFT CAROTID SYSTEM: Normal without aneurysm, dissection or stenosis. VERTEBRAL ARTERIES: Left dominant configuration. Both origins are clearly patent. There is no dissection, occlusion or flow-limiting stenosis to the skull base (V1-V3 segments). CTA HEAD FINDINGS POSTERIOR CIRCULATION: --Vertebral arteries: Normal V4 segments. --Inferior cerebellar arteries: Normal. --Basilar artery: Normal. --Superior cerebellar arteries: Normal. --Posterior cerebral arteries (PCA): Normal. ANTERIOR CIRCULATION: --Intracranial internal carotid arteries: Normal. --Anterior cerebral arteries (ACA): Normal. Both A1 segments are present. Patent anterior communicating artery (a-comm). --Middle cerebral arteries (MCA): Normal. VENOUS SINUSES: As permitted by contrast timing, patent. ANATOMIC VARIANTS: None Review of the MIP images confirms the above findings. IMPRESSION: 1. Normal CTA of the head and neck. 2. Aberrant right subclavian artery. Electronically Signed   By: KUlyses JarredM.D.   On: 05/13/2022 00:47   CT HEAD WO CONTRAST  Result Date: 05/12/2022 CLINICAL DATA:  Headache, concern for MS EXAM: CT HEAD WITHOUT CONTRAST TECHNIQUE: Contiguous axial images were obtained from the base of the skull through the vertex without intravenous contrast. RADIATION DOSE REDUCTION: This exam was performed according to the departmental dose-optimization program which includes automated exposure control, adjustment of the mA and/or kV according to patient size and/or use of iterative reconstruction technique. COMPARISON:  None Available. FINDINGS: Brain: No evidence of  acute infarction, hemorrhage, hydrocephalus, extra-axial collection or mass lesion/mass effect. Vascular: No hyperdense vessel or unexpected calcification. Skull: Normal. Negative for  fracture or focal lesion. Sinuses/Orbits: No acute finding. Other: None. IMPRESSION: Normal head CT without contrast for age Electronically Signed   By: Jerilynn Mages.  Shick M.D.   On: 05/12/2022 17:26    Assessment: 31 y.o. female HTN presented to ED for right sided HA and right shoulder, arm and right thigh feeling weak, tingling, "at sleep" for the last 2 weeks.  He denies any weakness or numbness at forearms, hands or feet. Neuro exam benign. CTA head and neck unremarkable. Etiology for pt symptoms not quite clear, DDx including status migrainosus, complicated migraine and MS. Will do MRI and MRV as well as MRI C-spine to rule out structural lesion.  Will check CK, TSH, ESR and CRP.  Plan: - MRI brain and MRV head - MRI C-spine with and without - CK, TSH, ESR and CRP - PT/OT - need quicker appointment with Dr. Tomi Likens at [facility] - will follow  Thank you for this consultation and allowing [place] to participate in the care of this patient.  Rosalin Hawking, MD PhD Stroke Neurology 05/13/2022 4:08 AM

## 2022-05-13 NOTE — ED Notes (Signed)
Neuro at bedside.

## 2022-05-13 NOTE — Discharge Instructions (Addendum)
Please follow-up with your primary care doctor for ongoing management of your symptoms.  Additionally, there is a number below to call to establish care with neurology, who are specialists that can be helpful for recurrent headaches.  Take ibuprofen as needed.  The prescription was sent to your pharmacy.  Avoid taking more than 2400 mg/day.  Return to the emergency department for any worsening symptoms.

## 2022-10-03 NOTE — Progress Notes (Deleted)
NEUROLOGY CONSULTATION NOTE  Linda Snyder MRN: [id number] DOB: [birth year]  Referring provider: Gracy Racer, PA-C Primary care provider: Gracy Racer, PA-C  Reason for consult:  migraine  Assessment/Plan:   ***   Subjective:  Linda Snyder is a 31 year old female who presents for migraines.  History supplemented by referring provider note.  She is accompanied by a Spanish-speaking interpreter. MRI and CTA personally reviewed.  ***.  She was prescribed amitriptyline and sumatriptan for suspected migraine but recommended going to the ED on 05/12/2022 to receive imaging to rule out MS.  MRI of brain and cervical spine with and without contrast were normal.  MRV of head was normal.  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  Past NSAIDS/analgesics:  *** Past abortive triptans:  *** Past abortive ergotamine:  *** Past muscle relaxants:  *** Past anti-emetic:  *** Past antihypertensive medications:  *** Past antidepressant medications:  *** Past anticonvulsant medications:  *** Past anti-CGRP:  *** Past vitamins/Herbal/Supplements:  *** Past antihistamines/decongestants:  *** Other past therapies:  ***  Current NSAIDS/analgesics:  ibuprofen 600mg , acetaminophen Current triptans:  sumatriptan 25mg  Current ergotamine:  none Current anti-emetic:  none Current muscle relaxants:  none Current Antihypertensive medications:  none Current Antidepressant medications:  amitriptyline 10mg  [facility] Current Anticonvulsant medications:  none Current anti-CGRP:  none Current Vitamins/Herbal/Supplements:  none Current Antihistamines/Decongestants:  none Other therapy:  none Hormone/birth control:  none   Caffeine:  *** Alcohol:  *** Smoker:  *** Diet:  *** Exercise:  *** Depression:  ***; Anxiety:  *** Other pain:  *** Sleep hygiene:  *** Family history of headache:   ***      PAST MEDICAL HISTORY: Past Medical History:  Diagnosis Date   Medical history non-contributory     PAST SURGICAL HISTORY: Past Surgical History:  Procedure Laterality Date   NO PAST SURGERIES      MEDICATIONS: Current Outpatient Medications on File Prior to Visit  Medication Sig Dispense Refill   acetaminophen (TYLENOL) 500 MG tablet Take 1,000 mg by mouth every 6 (six) hours as needed for moderate pain or headache.     amitriptyline (ELAVIL) 10 MG tablet Take 10 mg by mouth at bedtime.     ibuprofen (ADVIL) 600 MG tablet Take 1 tablet (600 mg total) by mouth every 6 (six) hours as needed. 30 tablet 0   OVER THE COUNTER MEDICATION Take 1 Bottle by mouth daily. Mix with orange juice and drink every morning Neurobion-memory supplement from     SUMAtriptan (IMITREX) 25 MG tablet Take 25 mg by mouth every 4 (four) hours as needed for migraine or headache.     No current facility-administered medications on file prior to visit.    ALLERGIES: No Known Allergies  FAMILY HISTORY: No family history on file.  Objective:  *** General: No acute distress.  Patient appears well-groomed.   Head:  Normocephalic/atraumatic Eyes:  fundi examined but not visualized Neck: supple, no paraspinal tenderness, full range of motion Back: No paraspinal tenderness Heart: regular rate and rhythm Lungs: Clear to auscultation bilaterally. Vascular: No carotid bruits. Neurological Exam: Mental status: alert and oriented to person, place, and time, speech fluent and not dysarthric, language intact. Cranial nerves: CN I: not tested CN II: pupils equal, round and reactive to light, visual fields intact CN III, IV, VI:  full range of motion, no nystagmus, no ptosis CN V: facial sensation  intact. CN VII: upper and lower face symmetric CN VIII: hearing intact CN IX, X: gag intact, uvula midline CN XI: sternocleidomastoid and trapezius muscles intact CN XII: tongue midline Bulk  & Tone: normal, no fasciculations. Motor:  muscle strength 5/5 throughout Sensation:  Pinprick, temperature and vibratory sensation intact. Deep Tendon Reflexes:  2+ throughout,  toes downgoing.   Finger to nose testing:  Without dysmetria.   Heel to shin:  Without dysmetria.   Gait:  Normal station and stride.  Romberg negative.    Thank you for allowing me to take part in the care of this patient.  Metta Clines, DO  CC: ***

## 2022-10-05 ENCOUNTER — Encounter: Payer: Self-pay | Admitting: Neurology

## 2022-10-05 ENCOUNTER — Ambulatory Visit: Payer: Self-pay | Admitting: Neurology

## 2022-10-05 DIAGNOSIS — Z029 Encounter for administrative examinations, unspecified: Secondary | ICD-10-CM
